# Patient Record
Sex: Female | Born: 1950 | Race: Black or African American | Hispanic: No | Marital: Single | Smoking: Current every day smoker
Health system: Southern US, Community
[De-identification: ages and names within clinical notes are randomized; demographics above are authoritative.]

## PROBLEM LIST (undated history)

## (undated) DIAGNOSIS — M1990 Unspecified osteoarthritis, unspecified site: Secondary | ICD-10-CM

## (undated) DIAGNOSIS — Z9889 Other specified postprocedural states: Secondary | ICD-10-CM

## (undated) DIAGNOSIS — R112 Nausea with vomiting, unspecified: Secondary | ICD-10-CM

## (undated) DIAGNOSIS — K219 Gastro-esophageal reflux disease without esophagitis: Secondary | ICD-10-CM

## (undated) DIAGNOSIS — I10 Essential (primary) hypertension: Secondary | ICD-10-CM

## (undated) HISTORY — PX: ABDOMINAL HYSTERECTOMY: SHX81

## (undated) HISTORY — PX: BLADDER SUSPENSION: SHX72

## (undated) HISTORY — PX: TUBAL LIGATION: SHX77

---

## 2004-06-07 ENCOUNTER — Emergency Department: Payer: Self-pay | Admitting: Unknown Physician Specialty

## 2004-10-07 ENCOUNTER — Emergency Department: Payer: Self-pay | Admitting: Emergency Medicine

## 2005-09-20 ENCOUNTER — Emergency Department: Payer: Self-pay | Admitting: Emergency Medicine

## 2005-11-27 ENCOUNTER — Emergency Department (HOSPITAL_COMMUNITY): Admission: EM | Admit: 2005-11-27 | Discharge: 2005-11-27 | Payer: Self-pay | Admitting: Emergency Medicine

## 2007-04-30 ENCOUNTER — Emergency Department: Payer: Self-pay | Admitting: Unknown Physician Specialty

## 2007-05-29 ENCOUNTER — Ambulatory Visit: Payer: Self-pay | Admitting: Unknown Physician Specialty

## 2007-08-25 ENCOUNTER — Ambulatory Visit: Payer: Self-pay

## 2007-08-31 ENCOUNTER — Ambulatory Visit: Payer: Self-pay

## 2008-05-02 ENCOUNTER — Emergency Department: Payer: Self-pay

## 2008-09-19 ENCOUNTER — Inpatient Hospital Stay (HOSPITAL_COMMUNITY): Admission: EM | Admit: 2008-09-19 | Discharge: 2008-09-20 | Payer: Self-pay | Admitting: Internal Medicine

## 2008-11-09 ENCOUNTER — Ambulatory Visit: Payer: Self-pay | Admitting: Internal Medicine

## 2008-11-09 LAB — CONVERTED CEMR LAB
ALT: 16 units/L (ref 0–35)
AST: 16 units/L (ref 0–37)
Albumin: 4.1 g/dL (ref 3.5–5.2)
Barbiturate Quant, Ur: NEGATIVE
Benzodiazepines.: NEGATIVE
Calcium: 9.9 mg/dL (ref 8.4–10.5)
Chloride: 105 meq/L (ref 96–112)
Cocaine Metabolites: POSITIVE — AB
Creatinine,U: 147.6 mg/dL
Microalb, Ur: 0.5 mg/dL (ref 0.00–1.89)
Phencyclidine (PCP): NEGATIVE
Potassium: 4.2 meq/L (ref 3.5–5.3)
Total Protein: 7.6 g/dL (ref 6.0–8.3)

## 2008-11-16 ENCOUNTER — Ambulatory Visit: Payer: Self-pay | Admitting: *Deleted

## 2008-11-28 ENCOUNTER — Emergency Department: Payer: Self-pay | Admitting: Emergency Medicine

## 2009-01-16 ENCOUNTER — Emergency Department: Payer: Self-pay

## 2009-02-10 ENCOUNTER — Emergency Department: Payer: Self-pay | Admitting: Emergency Medicine

## 2009-03-06 ENCOUNTER — Emergency Department: Payer: Self-pay | Admitting: Emergency Medicine

## 2009-03-30 ENCOUNTER — Emergency Department: Payer: Self-pay | Admitting: Emergency Medicine

## 2009-06-13 ENCOUNTER — Emergency Department: Payer: Self-pay | Admitting: Emergency Medicine

## 2010-01-24 ENCOUNTER — Emergency Department: Payer: Self-pay | Admitting: Internal Medicine

## 2010-04-01 ENCOUNTER — Emergency Department (HOSPITAL_COMMUNITY): Admission: EM | Admit: 2010-04-01 | Discharge: 2010-04-01 | Payer: Self-pay | Admitting: Emergency Medicine

## 2010-06-19 ENCOUNTER — Emergency Department (HOSPITAL_COMMUNITY): Admission: EM | Admit: 2010-06-19 | Discharge: 2010-06-19 | Payer: Self-pay | Admitting: Emergency Medicine

## 2010-10-02 ENCOUNTER — Emergency Department: Payer: Self-pay | Admitting: Emergency Medicine

## 2010-11-13 LAB — COMPREHENSIVE METABOLIC PANEL
ALT: 17 U/L (ref 0–35)
AST: 24 U/L (ref 0–37)
Albumin: 3.2 g/dL — ABNORMAL LOW (ref 3.5–5.2)
Alkaline Phosphatase: 88 U/L (ref 39–117)
BUN: 16 mg/dL (ref 6–23)
Chloride: 110 mEq/L (ref 96–112)
GFR calc Af Amer: >60 mL/min (ref >60)
Potassium: 4.4 mEq/L (ref 3.5–5.1)
Sodium: 139 mEq/L (ref 135–145)
Total Bilirubin: 0.4 mg/dL (ref 0.3–1.2)

## 2010-11-13 LAB — URINALYSIS, ROUTINE W REFLEX MICROSCOPIC
Ketones, ur: NEGATIVE mg/dL
Nitrite: NEGATIVE
Specific Gravity, Urine: 1.023 (ref 1.005–1.030)
pH: 5.5 (ref 5.0–8.0)

## 2010-11-13 LAB — DIFFERENTIAL
Basophils Absolute: 0 10*3/uL (ref 0.0–0.1)
Lymphocytes Relative: 11 % — ABNORMAL LOW (ref 12–46)
Monocytes Absolute: 0.5 10*3/uL (ref 0.1–1.0)
Neutro Abs: 8.3 10*3/uL — ABNORMAL HIGH (ref 1.7–7.7)

## 2010-11-13 LAB — CULTURE, BLOOD (ROUTINE X 2): Culture: NO GROWTH

## 2010-11-13 LAB — RAPID URINE DRUG SCREEN, HOSP PERFORMED
Amphetamines: NOT DETECTED
Tetrahydrocannabinol: POSITIVE — AB

## 2010-11-13 LAB — CBC
Hemoglobin: 13.1 g/dL (ref 12.0–15.0)
RDW: 16.9 % — ABNORMAL HIGH (ref 11.5–15.5)

## 2010-12-11 NOTE — H&P (Signed)
Katherine Hartman, Katherine Hartman                ACCOUNT NO.:  [id number]   MEDICAL RECORD NO.:  [id number]          PATIENT TYPE:  EMS   LOCATION:  MAJO                         FACILITY:  [facility]   PHYSICIAN:  Lonia Blood, M.D.      DATE OF BIRTH:  [birth year]   DATE OF ADMISSION:  09/19/2008  DATE OF DISCHARGE:                              HISTORY & PHYSICAL   PRIMARY CARE PHYSICIAN:  The patient is unassigned to [place].   PRESENTING COMPLAINT:  Nausea, vomiting, and abdominal pain.   HISTORY OF PRESENT ILLNESS:  The patient is a 60 year old African  American female with no significant past medical history who has not  been on any medications who apparently started experiencing epigastric  pain, nausea, and vomiting lasted the whole day.  She has 1-2 loose  bowel movements.  Otherwise, she has consistently had the nausea and  vomiting and unable to keep anything down.  She denied any fever  associated with it.  She denied any hematochezia.  No melena.  No bright  red blood per rectum.  The pain was rated as colicky in nature and 8-9  out of 10.  No relieving or aggravating factor.  More or less worsened  by foods.  She gets nauseated and vomits.  She denied any urinary  symptoms and denied any cough.  Denied any chest pain.   PAST MEDICAL HISTORY:  None.   ALLERGIES:  Allergic to IBUPROFEN.   PAST SURGICAL HISTORY:  None.   SOCIAL HISTORY:  The patient lives in [place].  She smokes about half  to one pack per day.  Denied any IV drug use.  No alcohol.   FAMILY HISTORY:  Significant for breast cancer, diabetes, and  hypertension on both sides of her family.   REVIEW OF SYSTEMS:  A 12-point review of systems is negative except per  HPI.   PHYSICAL EXAMINATION:  VITAL SIGNS:  Temperature 99.1.  Initial blood  pressure 185/117, currently 155/96.  Pulse of 98, respiratory rate 26,  sats 100% on room air.  GENERAL:  The patient looks acutely ill in mild distress.  HEENT:  PERRL.  EOMI.  NECK:   Supple.  No JVD.  No lymphadenopathy.  RESPIRATORY:  Good air entry bilaterally.  No wheezes, no rales.  CARDIOVASCULAR:  She has S1 and S2.  No murmur.  ABDOMEN:  Firm with epigastric tenderness and mild rebound tenderness.  She has some decreased bowel sounds.  EXTREMITIES:  No edema, cyanosis, or clubbing.   LABORATORY DATA:  Urinalysis is negative.  White count 10.2 with left  shift, ANC of 8.3, hemoglobin is 13.1, platelet count 281 with MCV of  83.  Sodium is 139, potassium 4.4, chloride 110, CO2 of 22, glucose 82,  BUN 16, creatinine 0.88, albumin 3.2, and calcium 9.0, the rest of the  LFTs are normal with alkaline phosphatase of 88, AST 24, ALT 17.  Lipase  is 25.  Acute abdominal series showed air-fluid levels without  dilatation of colon suggestive of ileus.  There was mild __________  scoliosis and low lung volumes without focal  airspace disease.  Abdominal ultrasound showed dilated common bile duct.  Recommendation of  liver function studies was suggested and probably ERCP or MRCP.  There  was no gallstone or sonographic findings for acute cholecystitis.  There  was unremarkable sonographic appearance of the pancreas, liver, spleen,  and both kidneys.  CT abdomen and pelvis showed stable mild biliary  dilatation of undetermined etiology and no obstructive bowel gas  pattern.   ASSESSMENT:  Therefore, this is a 60 year old female who presents with  nausea, vomiting, and abdominal pain.  The patient seems to have some  biliary colic with no apparent gallstones.  The mild ileus and dilated  common bile duct may suggest that she may have passed gallstones, but  again with her age and history, we cannot rule out something at the  sphincter like tumor, although that is not seen on CT.  The patient also  did not have any obstructive pattern since her LFTs are essentially  normal.  The cause is therefore quite unclear, but the severity of her  pain indicates that she did have some  disease in that area.   PLAN:  1. Biliary colic:  We will admit the patient for pain control.  I will      keep her n.p.o., hydrate her, and then do the pain control.  We      will get MRCP and possibly GI consult for subsequent ERCP.  Surgery      has been consulted, but it looks like there is no acute surgical      disease at this point.  Although the patient has some white count      and mild fever, there is no evidence of infection, so I will be      reluctant to start any antibiotics at this point.  Conservative      measures probably will be the best option for now.  2. High blood pressure.  The patient has no prior history of      hypertension.  We will follow her blood pressure in the hospital      and if it remains consistently high, we will treat the patient for      hypertension.  3. Tobacco abuse.  The patient has been counseled.  We will offer her      nicotine patch in the hospital for any type of cravings.      Otherwise, further treatment will depend on the patient's response      to our initial measures in the hospital.      Lonia Blood, M.D.  Electronically Signed     LG/MEDQ  D:  09/19/2008  T:  09/20/2008  Job:  [id number]

## 2010-12-11 NOTE — Discharge Summary (Signed)
[name], Katherine Hartman                ACCOUNT NO.:  [id number]   MEDICAL RECORD NO.:  [id number]          PATIENT TYPE:  INP   LOCATION:  5151                         FACILITY:  [facility]   PHYSICIAN:  Richarda Overlie, MD       DATE OF BIRTH:  06/05/1951   DATE OF ADMISSION:  09/19/2008  DATE OF DISCHARGE:  09/20/2008                               DISCHARGE SUMMARY   DISCHARGE DIAGNOSIS:  Nausea, vomiting, and abdominal pain likely viral  gastroenteritis  ileus resolved   SUBJECTIVE:  This is a 60 year old female who presented to the ER with a  chief complaint of nausea, vomiting, abdominal pain, and epigastric  pain.  She denied any associated fever, chills, rigors.  Denied any  diarrhea or constipation.  Denied any hematochezia or melena.  Denied  any bright red blood per rectum.  Pain was described as colicky in  nature.  Initial urinalysis was negative.  White count was negative.  Electrolytes were within normal limits.  Her liver panel and lipase were  also within normal limits.  Her urine drug screen was positive for  cocaine, opiates, and marijuana.  The patient had multiple diagnostic  studies done including an ultrasound of the abdomen that showed dilated  common bile duct dilated to about 8.3 mm.  An acute abdominal series  which was negative suggestive of ileus but no obstruction.  She also had  a CT scan of the abdomen and pelvis that showed stable mild biliary  dilatation of undetermined etiology and a nonobstructive bowel gas  pattern.  Because of her age, an MRCP was ordered that showed normal  appearance of the biliary and pancreatic duct.  No evidence of biliary  dilatation.  No evidence of choledocholithiasis.  These findings were  discussed with Dr. Evette Cristal, [facility], on call at [phone] and at this  point we did not feel that the patient needs any further investigation  given the normal ERCP.  I have recommended the patient get repeat CMP  and lipase in 5 to 7 days.   Recommended a low-fat, low-cholesterol,  lactose-free, mechanical, soft diet.   FOLLOWUP:  With [facility] in 5 to 7 days.   DISCHARGE MEDICATIONS:  1. Phenergan 25 mg per rectum every 6 hours p.r.n.  2. Ultram 50 mg p.o. every 6 hours p.r.n.  3. Prilosec 20 mg p.o. daily.      Richarda Overlie, MD  Electronically Signed     NA/MEDQ  D:  09/20/2008  T:  09/20/2008  Job:  [id number]

## 2011-02-05 ENCOUNTER — Emergency Department: Payer: Self-pay | Admitting: Emergency Medicine

## 2011-06-13 ENCOUNTER — Emergency Department: Payer: Self-pay | Admitting: *Deleted

## 2011-11-04 ENCOUNTER — Emergency Department (HOSPITAL_COMMUNITY)
Admission: EM | Admit: 2011-11-04 | Discharge: 2011-11-04 | Disposition: A | Discharge status: Home / Self Care | Payer: Self-pay | Attending: Emergency Medicine | Admitting: Emergency Medicine

## 2011-11-04 ENCOUNTER — Encounter (HOSPITAL_COMMUNITY): Payer: Self-pay | Admitting: Emergency Medicine

## 2011-11-04 ENCOUNTER — Emergency Department (HOSPITAL_COMMUNITY): Payer: Self-pay

## 2011-11-04 DIAGNOSIS — W108XXA Fall (on) (from) other stairs and steps, initial encounter: Secondary | ICD-10-CM | POA: Insufficient documentation

## 2011-11-04 DIAGNOSIS — M1710 Unilateral primary osteoarthritis, unspecified knee: Secondary | ICD-10-CM | POA: Insufficient documentation

## 2011-11-04 DIAGNOSIS — M25569 Pain in unspecified knee: Secondary | ICD-10-CM | POA: Insufficient documentation

## 2011-11-04 DIAGNOSIS — M7989 Other specified soft tissue disorders: Secondary | ICD-10-CM | POA: Insufficient documentation

## 2011-11-04 DIAGNOSIS — M1711 Unilateral primary osteoarthritis, right knee: Secondary | ICD-10-CM

## 2011-11-04 HISTORY — DX: Unspecified osteoarthritis, unspecified site: M19.90

## 2011-11-04 MED ORDER — CYCLOBENZAPRINE HCL 10 MG PO TABS: 10.0000 mg | ORAL_TABLET | Freq: Two times a day (BID) | ORAL | Status: AC | PRN

## 2011-11-04 MED ORDER — OXYCODONE-ACETAMINOPHEN 5-325 MG PO TABS: 1.0000 | ORAL_TABLET | ORAL | Status: AC | PRN

## 2011-11-04 MED ORDER — CYCLOBENZAPRINE HCL 10 MG PO TABS
10.0000 mg | ORAL_TABLET | Freq: Once | ORAL | Status: AC
  Administered 2011-11-04: 10 mg via ORAL
  Filled 2011-11-04: qty 1

## 2011-11-04 MED ORDER — OXYCODONE-ACETAMINOPHEN 5-325 MG PO TABS
1.0000 | ORAL_TABLET | Freq: Once | ORAL | Status: AC
  Administered 2011-11-04: 1 via ORAL
  Filled 2011-11-04: qty 1

## 2011-11-04 NOTE — ED Provider Notes (Signed)
History     CSN: [id number]  Arrival date & time 11/04/11  1220   First MD Initiated Contact with Patient 11/04/11 1238      No chief complaint on file.   (Consider location/radiation/quality/duration/timing/severity/associated sxs/prior treatment) Patient is a 60 y.o. female presenting with knee pain. The history is provided by the patient.  Knee Pain This is a new problem. The current episode started today. The problem occurs constantly. The problem has been gradually worsening. Associated symptoms include arthralgias and joint swelling. Pertinent negatives include no chills or fever.  Pt states she has arthritis and torn meniscus in right knee. States today she was walking up the steps, and states "my knee gave out and I fell."  States she fell onto the right knee. Reports increased pain, worsened with walking and bending her knee. No other injuries. Pt is able to bear weight, with pain.  No past medical history on file.  No past surgical history on file.  No family history on file.  History  Substance Use Topics  . Smoking status: Not on file  . Smokeless tobacco: Not on file  . Alcohol Use: Not on file    OB History    No data available      Review of Systems  Constitutional: Negative for fever and chills.  HENT: Negative.   Eyes: Negative.   Respiratory: Negative.   Gastrointestinal: Negative.   Genitourinary: Negative.   Musculoskeletal: Positive for joint swelling and arthralgias.  Skin: Negative.   Neurological: Negative.   Psychiatric/Behavioral: Negative.     Allergies  Nsaids  Home Medications  No current outpatient prescriptions on file.  BP 157/96  Pulse 99  Temp(Src) 98 F (36.7 C) (Oral)  Resp 18  SpO2 98%  Physical Exam  Nursing note and vitals reviewed. Constitutional: She is oriented to person, place, and time. She appears well-developed and well-nourished. No distress.       obese  HENT:  Head: Normocephalic.  Eyes: Conjunctivae  are normal.  Cardiovascular: Normal rate, regular rhythm and normal heart sounds.   Pulmonary/Chest: Effort normal and breath sounds normal. No respiratory distress.  Musculoskeletal:       Right knee normal appearing. Tender to palpation over right anterior and later knee. No bruising or swelling. Pain with ROM. Limmited ROm due to pain. Negative anterior and posterior drawer signs. Normal ankle and hip  Neurological: She is alert and oriented to person, place, and time.  Skin: Skin is warm and dry. No erythema.  Psychiatric: She has a normal mood and affect.    ED Course  Procedures (including critical care time)  Chronic pain, worsened after a fall. Pain medications given per request. Will get x-ray to r/o fractures.    Dg Knee Complete 4 Views Right  11/04/2011  *RADIOLOGY REPORT*  Clinical Data: Lateral knee pain status post fall today.  History of arthritis.  RIGHT KNEE - COMPLETE 4+ VIEW  Comparison: 06/19/2010 radiographs.  Findings: There is mild generalized osteopenia.  Tricompartmental degenerative changes are again noted, most advanced in the patellofemoral compartment.  No acute fracture, dislocation or significant knee joint effusion is seen.  IMPRESSION: Stable tricompartmental degenerative changes.  No acute osseous findings.  Original Report Authenticated By: Gerrianne Scale, M.D.   X-ray with no acute findings. Pt has been ambulatory around ED. She seems stable on her feet, asking for pain medications. Will treat until she is able to follow up in few days. I suspect this is exacerbation of  her arthritis. Instructed to keep leg elevated, icing it, rest.   1. Arthritis of knee, right       MDM           Lottie Mussel, PA 11/04/11 1551

## 2011-11-04 NOTE — Discharge Instructions (Signed)
Your x-ray showed arthritis of your knee joint. No other findings. Keep your knee elevated at home. Ice. Percocet for pain. Flexeril for severe pain. Follow up with your doctor or orthopedics for further treatment.   Wear and Tear Disorders of the Knee (Arthritis, Osteoarthritis) Everyone will experience wear and tear injuries (arthritis, osteoarthritis) of the knee. These are the changes we all get as we age. They come from the joint stress of daily living. The amount of cartilage damage in your knee and your symptoms determine if you need surgery. Mild problems require approximately two months recovery time. More severe problems take several months to recover. With mild problems, your surgeon may find worn and rough cartilage surfaces. With severe changes, your surgeon may find cartilage that has completely worn away and exposed the bone. Loose bodies of bone and cartilage, bone spurs (excess bone growth), and injuries to the menisci (cushions between the large bones of your leg) are also common. All of these problems can cause pain. For a mild wear and tear problem, rough cartilage may simply need to be shaved and smoothed. For more severe problems with areas of exposed bone, your surgeon may use an instrument for roughing up the bone surfaces to stimulate new cartilage growth. Loose bodies are usually removed. Torn menisci may be trimmed or repaired. ABOUT THE ARTHROSCOPIC PROCEDURE Arthroscopy is a surgical technique. It allows your orthopedic surgeon to diagnose and treat your knee injury with accuracy. The surgeon looks into your knee through a small scope. The scope is like a small (pencil-sized) telescope. Arthroscopy is less invasive than open knee surgery. You can expect a more rapid recovery. After the procedure, you will be moved to a recovery area until most of the effects of the medication have worn off. Your caregiver will discuss the test results with you. RECOVERY The severity of the  arthritis and the type of procedure performed will determine recovery time. Other important factors include age, physical condition, medical conditions, and the type of rehabilitation program. Strengthening your muscles after arthroscopy helps guarantee a better recovery. Follow your caregiver's instructions. Use crutches, rest, elevate, ice, and do knee exercises as instructed. Your caregivers will help you and instruct you with exercises and other physical therapy required to regain your mobility, muscle strength, and functioning following surgery. Only take over-the-counter or prescription medicines for pain, discomfort, or fever as directed by your caregiver.  SEEK MEDICAL CARE IF:   There is increased bleeding (more than a small spot) from the wound.   You notice redness, swelling, or increasing pain in the wound.   Pus is coming from wound.   You develop an unexplained oral temperature above 102 F (38.9 C) , or as your caregiver suggests.   You notice a foul smell coming from the wound or dressing.   You have severe pain with motion of the knee.  SEEK IMMEDIATE MEDICAL CARE IF:   You develop a rash.   You have difficulty breathing.   You have any allergic problems.  MAKE SURE YOU:   Understand these instructions.   Will watch your condition.   Will get help right away if you are not doing well or get worse.  Document Released: 07/12/2000 Document Revised: 07/04/2011 Document Reviewed: 12/09/2007 [facility] Patient Information 2012 [place], [place].

## 2011-11-05 NOTE — ED Provider Notes (Signed)
Medical screening examination/treatment/procedure(s) were performed by non-physician practitioner and as supervising physician I was immediately available for consultation/collaboration.  Tabor Bartram T Shirelle Tootle, MD 11/05/11 0757 

## 2012-01-09 ENCOUNTER — Encounter (HOSPITAL_COMMUNITY): Payer: Self-pay | Admitting: *Deleted

## 2012-01-09 ENCOUNTER — Emergency Department (HOSPITAL_COMMUNITY)
Admission: EM | Admit: 2012-01-09 | Discharge: 2012-01-09 | Disposition: A | Discharge status: Home / Self Care | Payer: Self-pay | Attending: Emergency Medicine | Admitting: Emergency Medicine

## 2012-01-09 DIAGNOSIS — F17200 Nicotine dependence, unspecified, uncomplicated: Secondary | ICD-10-CM | POA: Insufficient documentation

## 2012-01-09 DIAGNOSIS — IMO0002 Reserved for concepts with insufficient information to code with codable children: Secondary | ICD-10-CM | POA: Insufficient documentation

## 2012-01-09 DIAGNOSIS — M25569 Pain in unspecified knee: Secondary | ICD-10-CM | POA: Insufficient documentation

## 2012-01-09 DIAGNOSIS — M25561 Pain in right knee: Secondary | ICD-10-CM

## 2012-01-09 MED ORDER — IBUPROFEN 200 MG PO TABS
600.0000 mg | ORAL_TABLET | Freq: Once | ORAL | Status: AC
  Administered 2012-01-09: 600 mg via ORAL
  Filled 2012-01-09: qty 3

## 2012-01-09 MED ORDER — OXYCODONE-ACETAMINOPHEN 5-325 MG PO TABS
2.0000 | ORAL_TABLET | Freq: Once | ORAL | Status: AC
  Administered 2012-01-09: 2 via ORAL
  Filled 2012-01-09: qty 2

## 2012-01-09 MED ORDER — OXYCODONE-ACETAMINOPHEN 5-325 MG PO TABS: 1.0000 | ORAL_TABLET | ORAL | Status: AC | PRN

## 2012-01-09 MED ORDER — IBUPROFEN 600 MG PO TABS: 600.0000 mg | ORAL_TABLET | Freq: Three times a day (TID) | ORAL | Status: AC | PRN

## 2012-01-09 NOTE — Discharge Instructions (Signed)
Knee Pain The knee is the complex joint between your thigh and your lower leg. It is made up of bones, tendons, ligaments, and cartilage. The bones that make up the knee are:  The femur in the thigh.   The tibia and fibula in the lower leg.   The patella or kneecap riding in the groove on the lower femur.  CAUSES  Knee pain is a common complaint with many causes. A few of these causes are:  Injury, such as:   A ruptured ligament or tendon injury.   Torn cartilage.   Medical conditions, such as:   Gout   Arthritis   Infections   Overuse, over training or overdoing a physical activity.  Knee pain can be minor or severe. Knee pain can accompany debilitating injury. Minor knee problems often respond well to self-care measures or get well on their own. More serious injuries may need medical intervention or even surgery. SYMPTOMS The knee is complex. Symptoms of knee problems can vary widely. Some of the problems are:  Pain with movement and weight bearing.   Swelling and tenderness.   Buckling of the knee.   Inability to straighten or extend your knee.   Your knee locks and you cannot straighten it.   Warmth and redness with pain and fever.   Deformity or dislocation of the kneecap.  DIAGNOSIS  Determining what is wrong may be very straight forward such as when there is an injury. It can also be challenging because of the complexity of the knee. Tests to make a diagnosis may include:  Your caregiver taking a history and doing a physical exam.   Routine X-rays can be used to rule out other problems. X-rays will not reveal a cartilage tear. Some injuries of the knee can be diagnosed by:   Arthroscopy a surgical technique by which a small video camera is inserted through tiny incisions on the sides of the knee. This procedure is used to examine and repair internal knee joint problems. Tiny instruments can be used during arthroscopy to repair the torn knee cartilage  (meniscus).   Arthrography is a radiology technique. A contrast liquid is directly injected into the knee joint. Internal structures of the knee joint then become visible on X-ray film.   An MRI scan is a non x-ray radiology procedure in which magnetic fields and a computer produce two- or three-dimensional images of the inside of the knee. Cartilage tears are often visible using an MRI scanner. MRI scans have largely replaced arthrography in diagnosing cartilage tears of the knee.   Blood work.   Examination of the fluid that helps to lubricate the knee joint (synovial fluid). This is done by taking a sample out using a needle and a syringe.  TREATMENT The treatment of knee problems depends on the cause. Some of these treatments are:  Depending on the injury, proper casting, splinting, surgery or physical therapy care will be needed.   Give yourself adequate recovery time. Do not overuse your joints. If you begin to get sore during workout routines, back off. Slow down or do fewer repetitions.   For repetitive activities such as cycling or running, maintain your strength and nutrition.   Alternate muscle groups. For example if you are a weight lifter, work the upper body on one day and the lower body the next.   Either tight or weak muscles do not give the proper support for your knee. Tight or weak muscles do not absorb the stress placed   on the knee joint. Keep the muscles surrounding the knee strong.   Take care of mechanical problems.   If you have flat feet, orthotics or special shoes may help. See your caregiver if you need help.   Arch supports, sometimes with wedges on the inner or outer aspect of the heel, can help. These can shift pressure away from the side of the knee most bothered by osteoarthritis.   A brace called an "unloader" brace also may be used to help ease the pressure on the most arthritic side of the knee.   If your caregiver has prescribed crutches, braces,  wraps or ice, use as directed. The acronym for this is PRICE. This means protection, rest, ice, compression and elevation.   Nonsteroidal anti-inflammatory drugs (NSAID's), can help relieve pain. But if taken immediately after an injury, they may actually increase swelling. Take NSAID's with food in your stomach. Stop them if you develop stomach problems. Do not take these if you have a history of ulcers, stomach pain or bleeding from the bowel. Do not take without your caregiver's approval if you have problems with fluid retention, heart failure, or kidney problems.   For ongoing knee problems, physical therapy may be helpful.   Glucosamine and chondroitin are over-the-counter dietary supplements. Both may help relieve the pain of osteoarthritis in the knee. These medicines are different from the usual anti-inflammatory drugs. Glucosamine may decrease the rate of cartilage destruction.   Injections of a corticosteroid drug into your knee joint may help reduce the symptoms of an arthritis flare-up. They may provide pain relief that lasts a few months. You may have to wait a few months between injections. The injections do have a small increased risk of infection, water retention and elevated blood sugar levels.   Hyaluronic acid injected into damaged joints may ease pain and provide lubrication. These injections may work by reducing inflammation. A series of shots may give relief for as long as 6 months.   Topical painkillers. Applying certain ointments to your skin may help relieve the pain and stiffness of osteoarthritis. Ask your pharmacist for suggestions. Many over the-counter products are approved for temporary relief of arthritis pain.   In some countries, doctors often prescribe topical NSAID's for relief of chronic conditions such as arthritis and tendinitis. A review of treatment with NSAID creams found that they worked as well as oral medications but without the serious side effects.    PREVENTION  Maintain a healthy weight. Extra pounds put more strain on your joints.   Get strong, stay limber. Weak muscles are a common cause of knee injuries. Stretching is important. Include flexibility exercises in your workouts.   Be smart about exercise. If you have osteoarthritis, chronic knee pain or recurring injuries, you may need to change the way you exercise. This does not mean you have to stop being active. If your knees ache after jogging or playing basketball, consider switching to swimming, water aerobics or other low-impact activities, at least for a few days a week. Sometimes limiting high-impact activities will provide relief.   Make sure your shoes fit well. Choose footwear that is right for your sport.   Protect your knees. Use the proper gear for knee-sensitive activities. Use kneepads when playing volleyball or laying carpet. Buckle your seat belt every time you drive. Most shattered kneecaps occur in car accidents.   Rest when you are tired.  SEEK MEDICAL CARE IF:  You have knee pain that is continual and does not   seem to be getting better.  SEEK IMMEDIATE MEDICAL CARE IF:  Your knee joint feels hot to the touch and you have a high fever. MAKE SURE YOU:   Understand these instructions.   Will watch your condition.   Will get help right away if you are not doing well or get worse.  Document Released: 05/12/2007 Document Revised: 07/04/2011 Document Reviewed: 05/12/2007 [employer] Patient Information 2012 [employer]. 

## 2012-01-09 NOTE — ED Provider Notes (Signed)
History  Scribed for Katherine Co, MD, the patient was seen in room STRE1/STRE1. This chart was scribed by Katherine Hartman. The patient's care started at 11:55 AM   CSN: [id number]  Arrival date & time 01/09/12  1052   First MD Initiated Contact with Patient 01/09/12 1104      Chief Complaint  Patient presents with  . Knee Pain     The history is provided by the patient.   Katherine Hartman is a 61 y.o. female who presents to the Emergency Department complaining of right knee pain which started several months ago and has gotten worse recently.  She is experiencing swelling of the right leg.  She states that she recently bumped her knee.  Pt has h/o arthritis and a meniscus tear to the right knee.  She currently does not have an orthopaedic surgeon due to lack of insurance and states that she recently turned 61 and should be getting coverage soon.  Bending and walking makes the pain worse.  She has taken tylenol with no relief.  Her pain is moderate to severe in severity     Past Medical History  Diagnosis Date  . Arthritis     Past Surgical History  Procedure Date  . Abdominal hysterectomy   . Tubal ligation     Family History  Problem Relation Age of Onset  . Cancer Mother   . Hypertension Mother   . Diabetes Mother     History  Substance Use Topics  . Smoking status: Current Everyday Smoker    Types: Cigarettes  . Smokeless tobacco: Not on file  . Alcohol Use: No    OB History    Grav Para Term Preterm Abortions TAB SAB Ect Mult Living                  Review of Systems  All other systems reviewed and are negative.    Allergies  Nsaids  Home Medications   Current Outpatient Rx  Name Route Sig Dispense Refill  . ADULT MULTIVITAMIN W/MINERALS CH Oral Take 1 tablet by mouth daily.      BP 154/100  Pulse 101  Temp 97.9 F (36.6 C) (Oral)  Resp 18  SpO2 100%  Physical Exam  Nursing note and vitals reviewed. Constitutional: She is oriented to  person, place, and time. She appears well-developed and well-nourished. No distress.  HENT:  Head: Normocephalic and atraumatic.  Eyes: EOM are normal.  Neck: Neck supple. No tracheal deviation present.  Cardiovascular: Normal rate.   Pulmonary/Chest: Effort normal. No respiratory distress.  Musculoskeletal: Normal range of motion.       Normal pulse in right foot Mild medial and lateral joint line tenderness, no warmth or erythema.  No joint effusion.   Neurological: She is alert and oriented to person, place, and time.  Skin: Skin is warm and dry.  Psychiatric: She has a normal mood and affect. Her behavior is normal.    ED Course  Procedures   DIAGNOSTIC STUDIES: Oxygen Saturation is 100% on room air, normal by my interpretation.    COORDINATION OF CARE:     Labs Reviewed - No data to display No results found.   1. Right knee pain       MDM  This is likely arthritis of the patient's right knee.  There is no signs of septic arthritis.  She has normal pulses in her right leg.  She has no significant swelling of her right knee her  right leg to suggest DVT.  She does not require images at this time.  She and related to the bathroom without difficulty.  Her pain be treated and she will be referred to an orthopedic surgeon for ongoing followup  I personally performed the services described in this documentation, which was scribed in my presence. The recorded information has been reviewed and considered.          Katherine Co, MD 01/09/12 [phone]

## 2012-01-09 NOTE — ED Notes (Signed)
Pt reports chronic hx of right knee pain but states today it is worse, states she has been climbing stairs and sitting in recliner for extended periods of time.

## 2012-01-21 ENCOUNTER — Emergency Department: Payer: Self-pay | Admitting: Emergency Medicine

## 2012-02-14 ENCOUNTER — Emergency Department: Payer: Self-pay | Admitting: Emergency Medicine

## 2012-04-08 ENCOUNTER — Encounter (HOSPITAL_COMMUNITY): Payer: Self-pay | Admitting: Physical Medicine and Rehabilitation

## 2012-04-08 ENCOUNTER — Emergency Department (HOSPITAL_COMMUNITY): Payer: Self-pay

## 2012-04-08 ENCOUNTER — Emergency Department (HOSPITAL_COMMUNITY)
Admission: EM | Admit: 2012-04-08 | Discharge: 2012-04-08 | Disposition: A | Discharge status: Home / Self Care | Payer: Self-pay | Attending: Emergency Medicine | Admitting: Emergency Medicine

## 2012-04-08 DIAGNOSIS — Z809 Family history of malignant neoplasm, unspecified: Secondary | ICD-10-CM | POA: Insufficient documentation

## 2012-04-08 DIAGNOSIS — F17200 Nicotine dependence, unspecified, uncomplicated: Secondary | ICD-10-CM | POA: Insufficient documentation

## 2012-04-08 DIAGNOSIS — M25569 Pain in unspecified knee: Secondary | ICD-10-CM | POA: Insufficient documentation

## 2012-04-08 DIAGNOSIS — M1990 Unspecified osteoarthritis, unspecified site: Secondary | ICD-10-CM | POA: Insufficient documentation

## 2012-04-08 DIAGNOSIS — Z8249 Family history of ischemic heart disease and other diseases of the circulatory system: Secondary | ICD-10-CM | POA: Insufficient documentation

## 2012-04-08 DIAGNOSIS — Z833 Family history of diabetes mellitus: Secondary | ICD-10-CM | POA: Insufficient documentation

## 2012-04-08 DIAGNOSIS — Z888 Allergy status to other drugs, medicaments and biological substances status: Secondary | ICD-10-CM | POA: Insufficient documentation

## 2012-04-08 MED ORDER — PROMETHAZINE HCL 25 MG PO TABS
25.0000 mg | ORAL_TABLET | Freq: Four times a day (QID) | ORAL | Status: DC | PRN
Start: 2012-04-08 — End: 2012-07-30

## 2012-04-08 MED ORDER — OXYCODONE-ACETAMINOPHEN 5-325 MG PO TABS: 1.0000 | ORAL_TABLET | Freq: Once | ORAL | Status: AC

## 2012-04-08 MED ORDER — CYCLOBENZAPRINE HCL 10 MG PO TABS
10.0000 mg | ORAL_TABLET | Freq: Once | ORAL | Status: AC
  Administered 2012-04-08: 10 mg via ORAL
  Filled 2012-04-08: qty 1

## 2012-04-08 MED ORDER — OXYCODONE-ACETAMINOPHEN 5-325 MG PO TABS
1.0000 | ORAL_TABLET | Freq: Once | ORAL | Status: AC
  Administered 2012-04-08: 1 via ORAL
  Filled 2012-04-08: qty 1

## 2012-04-08 MED ORDER — CYCLOBENZAPRINE HCL 10 MG PO TABS: 10.0000 mg | ORAL_TABLET | Freq: Three times a day (TID) | ORAL | Status: AC | PRN

## 2012-04-08 NOTE — Discharge Instructions (Signed)
Arthralgia Your caregiver has diagnosed you as suffering from an arthralgia. Arthralgia means there is pain in a joint. This can come from many reasons including:  Bruising the joint which causes soreness (inflammation) in the joint.   Wear and tear on the joints which occur as we grow older (osteoarthritis).   Overusing the joint.   Various forms of arthritis.   Infections of the joint.  Regardless of the cause of pain in your joint, most of these different pains respond to anti-inflammatory drugs and rest. The exception to this is when a joint is infected, and these cases are treated with antibiotics, if it is a bacterial infection. HOME CARE INSTRUCTIONS   Rest the injured area for as long as directed by your caregiver. Then slowly start using the joint as directed by your caregiver and as the pain allows. Crutches as directed may be useful if the ankles, knees or hips are involved. If the knee was splinted or casted, continue use and care as directed. If an stretchy or elastic wrapping bandage has been applied today, it should be removed and re-applied every 3 to 4 hours. It should not be applied tightly, but firmly enough to keep swelling down. Watch toes and feet for swelling, bluish discoloration, coldness, numbness or excessive pain. If any of these problems (symptoms) occur, remove the ace bandage and re-apply more loosely. If these symptoms persist, contact your caregiver or return to this location.   For the first 24 hours, keep the injured extremity elevated on pillows while lying down.   Apply ice for 15 to 20 minutes to the sore joint every couple hours while awake for the first half day. Then 3 to 4 times per day for the first 48 hours. Put the ice in a plastic bag and place a towel between the bag of ice and your skin.   Wear any splinting, casting, elastic bandage applications, or slings as instructed.   Only take over-the-counter or prescription medicines for pain,  discomfort, or fever as directed by your caregiver. Do not use aspirin immediately after the injury unless instructed by your physician. Aspirin can cause increased bleeding and bruising of the tissues.   If you were given crutches, continue to use them as instructed and do not resume weight bearing on the sore joint until instructed.  Persistent pain and inability to use the sore joint as directed for more than 2 to 3 days are warning signs indicating that you should see a caregiver for a follow-up visit as soon as possible. Initially, a hairline fracture (break in bone) may not be evident on X-rays. Persistent pain and swelling indicate that further evaluation, non-weight bearing or use of the joint (use of crutches or slings as instructed), or further X-rays are indicated. X-rays may sometimes not show a small fracture until a week or 10 days later. Make a follow-up appointment with your own caregiver or one to whom we have referred you. A radiologist (specialist in reading X-rays) may read your X-rays. Make sure you know how you are to obtain your X-ray results. Do not assume everything is normal if you do not hear from us. SEEK MEDICAL CARE IF: Bruising, swelling, or pain increases. SEEK IMMEDIATE MEDICAL CARE IF:   Your fingers or toes are numb or blue.   The pain is not responding to medications and continues to stay the same or get worse.   The pain in your joint becomes severe.   You develop a fever over   102 F (38.9 C).   It becomes impossible to move or use the joint.  MAKE SURE YOU:   Understand these instructions.   Will watch your condition.   Will get help right away if you are not doing well or get worse.  Document Released: 07/15/2005 Document Revised: 07/04/2011 Document Reviewed: 03/02/2008 [employer] Patient Information 2012 [employer].Knee Pain The knee is the complex joint between your thigh and your lower leg. It is made up of bones, tendons, ligaments, and  cartilage. The bones that make up the knee are:  The femur in the thigh.   The tibia and fibula in the lower leg.   The patella or kneecap riding in the groove on the lower femur.  CAUSES  Knee pain is a common complaint with many causes. A few of these causes are:  Injury, such as:   A ruptured ligament or tendon injury.   Torn cartilage.   Medical conditions, such as:   Gout   Arthritis   Infections   Overuse, over training or overdoing a physical activity.  Knee pain can be minor or severe. Knee pain can accompany debilitating injury. Minor knee problems often respond well to self-care measures or get well on their own. More serious injuries may need medical intervention or even surgery. SYMPTOMS The knee is complex. Symptoms of knee problems can vary widely. Some of the problems are:  Pain with movement and weight bearing.   Swelling and tenderness.   Buckling of the knee.   Inability to straighten or extend your knee.   Your knee locks and you cannot straighten it.   Warmth and redness with pain and fever.   Deformity or dislocation of the kneecap.  DIAGNOSIS  Determining what is wrong may be very straight forward such as when there is an injury. It can also be challenging because of the complexity of the knee. Tests to make a diagnosis may include:  Your caregiver taking a history and doing a physical exam.   Routine X-rays can be used to rule out other problems. X-rays will not reveal a cartilage tear. Some injuries of the knee can be diagnosed by:   Arthroscopy a surgical technique by which a small video camera is inserted through tiny incisions on the sides of the knee. This procedure is used to examine and repair internal knee joint problems. Tiny instruments can be used during arthroscopy to repair the torn knee cartilage (meniscus).   Arthrography is a radiology technique. A contrast liquid is directly injected into the knee joint. Internal structures of  the knee joint then become visible on X-ray film.   An MRI scan is a non x-ray radiology procedure in which magnetic fields and a computer produce two- or three-dimensional images of the inside of the knee. Cartilage tears are often visible using an MRI scanner. MRI scans have largely replaced arthrography in diagnosing cartilage tears of the knee.   Blood work.   Examination of the fluid that helps to lubricate the knee joint (synovial fluid). This is done by taking a sample out using a needle and a syringe.  TREATMENT The treatment of knee problems depends on the cause. Some of these treatments are:  Depending on the injury, proper casting, splinting, surgery or physical therapy care will be needed.   Give yourself adequate recovery time. Do not overuse your joints. If you begin to get sore during workout routines, back off. Slow down or do fewer repetitions.   For repetitive activities   such as cycling or running, maintain your strength and nutrition.   Alternate muscle groups. For example if you are a weight lifter, work the upper body on one day and the lower body the next.   Either tight or weak muscles do not give the proper support for your knee. Tight or weak muscles do not absorb the stress placed on the knee joint. Keep the muscles surrounding the knee strong.   Take care of mechanical problems.   If you have flat feet, orthotics or special shoes may help. See your caregiver if you need help.   Arch supports, sometimes with wedges on the inner or outer aspect of the heel, can help. These can shift pressure away from the side of the knee most bothered by osteoarthritis.   A brace called an "unloader" brace also may be used to help ease the pressure on the most arthritic side of the knee.   If your caregiver has prescribed crutches, braces, wraps or ice, use as directed. The acronym for this is PRICE. This means protection, rest, ice, compression and elevation.   Nonsteroidal  anti-inflammatory drugs (NSAID's), can help relieve pain. But if taken immediately after an injury, they may actually increase swelling. Take NSAID's with food in your stomach. Stop them if you develop stomach problems. Do not take these if you have a history of ulcers, stomach pain or bleeding from the bowel. Do not take without your caregiver's approval if you have problems with fluid retention, heart failure, or kidney problems.   For ongoing knee problems, physical therapy may be helpful.   Glucosamine and chondroitin are over-the-counter dietary supplements. Both may help relieve the pain of osteoarthritis in the knee. These medicines are different from the usual anti-inflammatory drugs. Glucosamine may decrease the rate of cartilage destruction.   Injections of a corticosteroid drug into your knee joint may help reduce the symptoms of an arthritis flare-up. They may provide pain relief that lasts a few months. You may have to wait a few months between injections. The injections do have a small increased risk of infection, water retention and elevated blood sugar levels.   Hyaluronic acid injected into damaged joints may ease pain and provide lubrication. These injections may work by reducing inflammation. A series of shots may give relief for as long as 6 months.   Topical painkillers. Applying certain ointments to your skin may help relieve the pain and stiffness of osteoarthritis. Ask your pharmacist for suggestions. Many over the-counter products are approved for temporary relief of arthritis pain.   In some countries, doctors often prescribe topical NSAID's for relief of chronic conditions such as arthritis and tendinitis. A review of treatment with NSAID creams found that they worked as well as oral medications but without the serious side effects.  PREVENTION  Maintain a healthy weight. Extra pounds put more strain on your joints.   Get strong, stay limber. Weak muscles are a common  cause of knee injuries. Stretching is important. Include flexibility exercises in your workouts.   Be smart about exercise. If you have osteoarthritis, chronic knee pain or recurring injuries, you may need to change the way you exercise. This does not mean you have to stop being active. If your knees ache after jogging or playing basketball, consider switching to swimming, water aerobics or other low-impact activities, at least for a few days a week. Sometimes limiting high-impact activities will provide relief.   Make sure your shoes fit well. Choose footwear that is right   for your sport.   Protect your knees. Use the proper gear for knee-sensitive activities. Use kneepads when playing volleyball or laying carpet. Buckle your seat belt every time you drive. Most shattered kneecaps occur in car accidents.   Rest when you are tired.  SEEK MEDICAL CARE IF:  You have knee pain that is continual and does not seem to be getting better.  SEEK IMMEDIATE MEDICAL CARE IF:  Your knee joint feels hot to the touch and you have a high fever. MAKE SURE YOU:   Understand these instructions.   Will watch your condition.   Will get help right away if you are not doing well or get worse.  Document Released: 05/12/2007 Document Revised: 07/04/2011 Document Reviewed: 05/12/2007 [employer] Patient Information 2012 [employer]. 

## 2012-04-08 NOTE — ED Provider Notes (Signed)
History    This chart was scribed for Flint Melter, MD, MD by Smitty Pluck. The patient was seen in room TR04C and the patient's care was started at 1:39PM.   CSN: [id number]  Arrival date & time 04/08/12  1158   First MD Initiated Contact with Patient 04/08/12 1333      Chief Complaint  Patient presents with  . Knee Pain    (Consider location/radiation/quality/duration/timing/severity/associated sxs/prior treatment) Patient is a 61 y.o. female presenting with knee pain. The history is provided by the patient. No language interpreter was used.  Knee Pain Pertinent negatives include no shortness of breath.   Katherine Hartman is a 61 y.o. female who presents to the Emergency Department with hx of arthritis complaining of constant, moderate, bilateral knee pain onset 1 day ago due to fall onset 1 day ago. Pt reports that her left leg "gave out" causing her to fall to the ground while walking. She reports more pain with right knee compared to left knee. Pt is ambulatory but reports pain is aggravated by walking and bending.   Past Medical History  Diagnosis Date  . Arthritis     Past Surgical History  Procedure Date  . Abdominal hysterectomy   . Tubal ligation     Family History  Problem Relation Age of Onset  . Cancer Mother   . Hypertension Mother   . Diabetes Mother     History  Substance Use Topics  . Smoking status: Current Every Day Smoker    Types: Cigarettes  . Smokeless tobacco: Not on file  . Alcohol Use: No    OB History    Grav Para Term Preterm Abortions TAB SAB Ect Mult Living                  Review of Systems  Constitutional: Negative for fever and chills.  Respiratory: Negative for shortness of breath.   Gastrointestinal: Negative for nausea and vomiting.  Musculoskeletal: Positive for joint swelling and arthralgias.  Neurological: Negative for weakness.  All other systems reviewed and are negative.    Allergies  Nsaids  Home  Medications   Current Outpatient Rx  Name Route Sig Dispense Refill  . CYCLOBENZAPRINE HCL 10 MG PO TABS Oral Take 1 tablet (10 mg total) by mouth 3 (three) times daily as needed for muscle spasms. 30 tablet 0  . OXYCODONE-ACETAMINOPHEN 5-325 MG PO TABS Oral Take 1 tablet by mouth once. 30 tablet 0  . PROMETHAZINE HCL 25 MG PO TABS Oral Take 1 tablet (25 mg total) by mouth every 6 (six) hours as needed for nausea. 30 tablet 0    BP 180/102  Pulse 101  Temp 98.7 F (37.1 C) (Oral)  Resp 18  SpO2 100%  Physical Exam  Nursing note and vitals reviewed. Constitutional: She is oriented to person, place, and time. She appears well-developed and well-nourished. No distress.  HENT:  Head: Normocephalic and atraumatic.  Eyes: EOM are normal.  Neck: Neck supple. No tracheal deviation present.  Cardiovascular: Normal rate.   Pulmonary/Chest: Effort normal. No respiratory distress.  Musculoskeletal: Normal range of motion.       Anterior tenderness with swelling and small effusion of right knee   Neurological: She is alert and oriented to person, place, and time.  Skin: Skin is warm and dry.  Psychiatric: She has a normal mood and affect. Her behavior is normal.    ED Course  Procedures (including critical care time) DIAGNOSTIC STUDIES: Oxygen Saturation  is 100% on room air, normal by my interpretation.    COORDINATION OF CARE: 1:43 PM Discussed pt ED treatment with pt  1:43 PM Ordered:   Medications  promethazine (PHENERGAN) 25 MG tablet (not administered)  cyclobenzaprine (FLEXERIL) 10 MG tablet (not administered)  oxyCODONE-acetaminophen (PERCOCET/ROXICET) 5-325 MG per tablet (not administered)  cyclobenzaprine (FLEXERIL) tablet 10 mg (10 mg Oral Given 04/08/12 1401)  oxyCODONE-acetaminophen (PERCOCET/ROXICET) 5-325 MG per tablet 1 tablet (1 tablet Oral Given 04/08/12 1401)       Labs Reviewed - No data to display Dg Knee Complete 4 Views Right  04/08/2012  *RADIOLOGY  REPORT*  Clinical Data: Right knee pain.  History of fall.  RIGHT KNEE - COMPLETE 4+ VIEW  Comparison: 11/04/2011.  Findings: The bones are osteopenic.  No acute fracture, subluxation or dislocation.  There is joint space narrowing, subchondral sclerosis, subchondral cyst formation and osteophyte formation in a tricompartmental distribution, compatible with osteoarthritis. This is most severe in the patellofemoral compartment.  IMPRESSION: 1.  No acute radiographic abnormality of the right knee. 2.  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 3.  Osteopenia.   Original Report Authenticated By: Florencia Reasons, M.D.      1. Knee pain   2. Degenerative joint disease       MDM  Fall, with residual knee pain, and plain film evidence of arthritis, right knee. Doubt fracture, septic joint or radicular pain. She is stable for discharge.      I personally performed the services described in this documentation, which was scribed in my presence. The recorded information has been reviewed and considered.    Plan: Home Medications- Phenergan, Flexeril, Percocet; Home Treatments- Heat; Recommended follow up- Ortho f/u   Flint Melter, MD 04/08/12 1816

## 2012-04-08 NOTE — ED Notes (Signed)
Pt presents to department for evaluation of bilateral knee pain. States she missed step yesterday and fell down stairs, landed on both knees. 8/10 pain at the time. She is alert and oriented x4. Ambulatory to triage.

## 2012-07-30 ENCOUNTER — Emergency Department (HOSPITAL_COMMUNITY): Payer: Self-pay

## 2012-07-30 ENCOUNTER — Emergency Department (HOSPITAL_COMMUNITY)
Admission: EM | Admit: 2012-07-30 | Discharge: 2012-07-30 | Disposition: A | Discharge status: Home / Self Care | Payer: Self-pay | Attending: Emergency Medicine | Admitting: Emergency Medicine

## 2012-07-30 ENCOUNTER — Encounter (HOSPITAL_COMMUNITY): Payer: Self-pay | Admitting: Cardiology

## 2012-07-30 DIAGNOSIS — Y939 Activity, unspecified: Secondary | ICD-10-CM | POA: Insufficient documentation

## 2012-07-30 DIAGNOSIS — IMO0002 Reserved for concepts with insufficient information to code with codable children: Secondary | ICD-10-CM | POA: Insufficient documentation

## 2012-07-30 DIAGNOSIS — M549 Dorsalgia, unspecified: Secondary | ICD-10-CM

## 2012-07-30 DIAGNOSIS — Z8739 Personal history of other diseases of the musculoskeletal system and connective tissue: Secondary | ICD-10-CM | POA: Insufficient documentation

## 2012-07-30 DIAGNOSIS — Y929 Unspecified place or not applicable: Secondary | ICD-10-CM | POA: Insufficient documentation

## 2012-07-30 DIAGNOSIS — R296 Repeated falls: Secondary | ICD-10-CM | POA: Insufficient documentation

## 2012-07-30 DIAGNOSIS — S99929A Unspecified injury of unspecified foot, initial encounter: Secondary | ICD-10-CM | POA: Insufficient documentation

## 2012-07-30 DIAGNOSIS — S8990XA Unspecified injury of unspecified lower leg, initial encounter: Secondary | ICD-10-CM | POA: Insufficient documentation

## 2012-07-30 DIAGNOSIS — F17200 Nicotine dependence, unspecified, uncomplicated: Secondary | ICD-10-CM | POA: Insufficient documentation

## 2012-07-30 MED ORDER — CYCLOBENZAPRINE HCL 10 MG PO TABS
10.0000 mg | ORAL_TABLET | Freq: Once | ORAL | Status: AC
  Administered 2012-07-30: 10 mg via ORAL
  Filled 2012-07-30: qty 1

## 2012-07-30 MED ORDER — OXYCODONE-ACETAMINOPHEN 5-325 MG PO TABS
2.0000 | ORAL_TABLET | Freq: Once | ORAL | Status: AC
  Administered 2012-07-30: 2 via ORAL
  Filled 2012-07-30: qty 2

## 2012-07-30 MED ORDER — ONDANSETRON 4 MG PO TBDP
8.0000 mg | ORAL_TABLET | Freq: Once | ORAL | Status: AC
  Administered 2012-07-30: 8 mg via ORAL
  Filled 2012-07-30: qty 2

## 2012-07-30 MED ORDER — OXYCODONE-ACETAMINOPHEN 5-325 MG PO TABS: 2.0000 | ORAL_TABLET | Freq: Four times a day (QID) | ORAL | Status: DC | PRN

## 2012-07-30 MED ORDER — CYCLOBENZAPRINE HCL 10 MG PO TABS: 10.0000 mg | ORAL_TABLET | Freq: Three times a day (TID) | ORAL | Status: DC | PRN

## 2012-07-30 MED ORDER — PROMETHAZINE HCL 25 MG PO TABS: 25.0000 mg | ORAL_TABLET | Freq: Four times a day (QID) | ORAL | Status: DC | PRN

## 2012-07-30 NOTE — ED Notes (Signed)
Patient transported to X-ray 

## 2012-07-30 NOTE — ED Notes (Signed)
Returned from xray

## 2012-07-30 NOTE — ED Provider Notes (Signed)
History     CSN: [id number]  Arrival date & time 07/30/12  1019   First MD Initiated Contact with Patient 07/30/12 1029      Chief Complaint  Patient presents with  . Back Pain  . Knee Pain    (Consider location/radiation/quality/duration/timing/severity/associated sxs/prior treatment) Patient is a 62 y.o. female presenting with back pain and knee pain. The history is provided by the patient.  Back Pain  This is a new problem. Pertinent negatives include no fever, no numbness, no headaches and no abdominal pain. Associated symptoms comments: She reports that her left knee gave out and she fell yesterday to a sitting position causing back pain. No radiation of pain. No numbness, urinary incontinence or bowel incontinence.She reports the knee weakness is a chronic thing and is no worse today..  Knee Pain Pertinent negatives include no abdominal pain, chills, fever, headaches, nausea or numbness.    Past Medical History  Diagnosis Date  . Arthritis     Past Surgical History  Procedure Date  . Abdominal hysterectomy   . Tubal ligation     Family History  Problem Relation Age of Onset  . Cancer Mother   . Hypertension Mother   . Diabetes Mother     History  Substance Use Topics  . Smoking status: Current Every Day Smoker    Types: Cigarettes  . Smokeless tobacco: Not on file  . Alcohol Use: No    OB History    Grav Para Term Preterm Abortions TAB SAB Ect Mult Living                  Review of Systems  Constitutional: Negative for fever and chills.  HENT: Negative.   Respiratory: Negative.   Cardiovascular: Negative.   Gastrointestinal: Negative.  Negative for nausea and abdominal pain.  Musculoskeletal: Positive for back pain.  Skin: Negative.   Neurological: Negative.  Negative for numbness and headaches.    Allergies  Nsaids  Home Medications   Current Outpatient Rx  Name  Route  Sig  Dispense  Refill  . ADULT MULTIVITAMIN W/MINERALS CH   Oral  Take 1 tablet by mouth daily.           BP 148/98  Pulse 98  Temp 98.1 F (36.7 C) (Oral)  SpO2 100%  Physical Exam  Constitutional: She is oriented to person, place, and time. She appears well-developed and well-nourished.  HENT:  Head: Normocephalic.  Neck: Normal range of motion. Neck supple.  Cardiovascular: Normal rate and regular rhythm.   Pulmonary/Chest: Effort normal and breath sounds normal.  Abdominal: Soft. Bowel sounds are normal. There is no tenderness. There is no rebound and no guarding.  Musculoskeletal: Normal range of motion.       Mild lumbar and paralumbar tenderness without swelling or discoloration.  Neurological: She is alert and oriented to person, place, and time. She has normal reflexes. Coordination normal.  Skin: Skin is warm and dry. No rash noted.  Psychiatric: She has a normal mood and affect.    ED Course  Procedures (including critical care time)  Labs Reviewed - No data to display Dg Lumbar Spine Complete  07/30/2012  *RADIOLOGY REPORT*  Clinical Data: Fall, knee and back pain  LUMBAR SPINE - COMPLETE 4+ VIEW  Comparison: Prior CT abdomen 09/19/2008  Findings: Frontal, lateral and bilateral oblique radiographs of the lumbosacral spine demonstrates no evidence for acute fracture or malalignment.  Vertebral body heights are maintained.  The bones appear mildly osteopenic.  Visualized bowel gas pattern is unremarkable.  Very mild disc space narrowing at L5-S1 with increased sclerosis of the facets consistent with mild degenerative disc disease and facet arthropathy.  IMPRESSION:  1.  No acute fracture or malalignment 2.  Mild degenerative disc disease and facet arthropathy at L5-S1   Original Report Authenticated By: Malachy Moan, M.D.      No diagnosis found. 1. Back pain   MDM  Pain is improved with PO medications. X-ray negative. Will encourage PCP follow up if pain continues.        Arnoldo Hooker, PA-C 07/30/12 1231  Arnoldo Hooker, PA-C 08/13/12 [phone]

## 2012-07-30 NOTE — ED Notes (Signed)
Pt reports her right knee "gave out on her this morning". States "I slid down to my butt". Denies any LOC or hitting her head. No deformities noted.

## 2012-08-05 ENCOUNTER — Emergency Department: Payer: Self-pay | Admitting: Emergency Medicine

## 2012-08-14 NOTE — ED Provider Notes (Signed)
Medical screening examination/treatment/procedure(s) were performed by non-physician practitioner and as supervising physician I was immediately available for consultation/collaboration.    Nelia Shi, MD 08/14/12 2032

## 2012-11-05 ENCOUNTER — Encounter (HOSPITAL_COMMUNITY): Payer: Self-pay | Admitting: *Deleted

## 2012-11-05 ENCOUNTER — Emergency Department (HOSPITAL_COMMUNITY): Payer: Self-pay

## 2012-11-05 ENCOUNTER — Emergency Department (HOSPITAL_COMMUNITY)
Admission: EM | Admit: 2012-11-05 | Discharge: 2012-11-05 | Disposition: A | Discharge status: Home / Self Care | Payer: Self-pay | Attending: Emergency Medicine | Admitting: Emergency Medicine

## 2012-11-05 DIAGNOSIS — F17200 Nicotine dependence, unspecified, uncomplicated: Secondary | ICD-10-CM | POA: Insufficient documentation

## 2012-11-05 DIAGNOSIS — G8929 Other chronic pain: Secondary | ICD-10-CM | POA: Insufficient documentation

## 2012-11-05 DIAGNOSIS — M25569 Pain in unspecified knee: Secondary | ICD-10-CM | POA: Insufficient documentation

## 2012-11-05 DIAGNOSIS — Z8739 Personal history of other diseases of the musculoskeletal system and connective tissue: Secondary | ICD-10-CM | POA: Insufficient documentation

## 2012-11-05 MED ORDER — PROMETHAZINE HCL 25 MG PO TABS: 25.0000 mg | ORAL_TABLET | Freq: Four times a day (QID) | ORAL | Status: DC | PRN

## 2012-11-05 MED ORDER — OXYCODONE-ACETAMINOPHEN 5-325 MG PO TABS
2.0000 | ORAL_TABLET | Freq: Once | ORAL | Status: AC
  Administered 2012-11-05: 2 via ORAL
  Filled 2012-11-05: qty 2

## 2012-11-05 MED ORDER — CYCLOBENZAPRINE HCL 10 MG PO TABS
10.0000 mg | ORAL_TABLET | Freq: Once | ORAL | Status: AC
  Administered 2012-11-05: 10 mg via ORAL
  Filled 2012-11-05: qty 1

## 2012-11-05 MED ORDER — OXYCODONE-ACETAMINOPHEN 5-325 MG PO TABS: ORAL_TABLET | ORAL | Status: DC

## 2012-11-05 MED ORDER — CYCLOBENZAPRINE HCL 10 MG PO TABS: 10.0000 mg | ORAL_TABLET | Freq: Two times a day (BID) | ORAL | Status: DC | PRN

## 2012-11-05 NOTE — ED Notes (Signed)
Pt c/o R knee pain and swelling since moving from upstairs apt to downstairs.  Hx of "torn cartilage" to R knee.  No warmth or redness, but swelling noted.

## 2012-11-05 NOTE — ED Provider Notes (Signed)
History     CSN: [id number]  Arrival date & time 11/05/12  1120   First MD Initiated Contact with Patient 11/05/12 1137      Chief Complaint  Patient presents with  . Knee Pain    (Consider location/radiation/quality/duration/timing/severity/associated sxs/prior treatment) HPI  Katherine Hartman is a 62 y.o. female complaining of pain and swelling to right knee worsening over the course last 3 days. Patient has had this pain after she recently moved and was going up and down stairs. There is no direct trauma she recalls. Patient has history of arthritis and torn meniscus to the affected knee. Patient denies numbness, paresthesia, weakness. Pain is rated at 10 out of 10, but is exacerbated by movement and weightbearing. She's been taking ibuprofen and Goody powder with little relief.  Past Medical History  Diagnosis Date  . Arthritis     Past Surgical History  Procedure Laterality Date  . Abdominal hysterectomy    . Tubal ligation      Family History  Problem Relation Age of Onset  . Cancer Mother   . Hypertension Mother   . Diabetes Mother     History  Substance Use Topics  . Smoking status: Current Every Day Smoker -- 0.10 packs/day    Types: Cigarettes  . Smokeless tobacco: Current User    Types: Chew  . Alcohol Use: No    OB History   Grav Para Term Preterm Abortions TAB SAB Ect Mult Living                  Review of Systems  Constitutional: Negative for fever.  Respiratory: Negative for shortness of breath.   Cardiovascular: Negative for chest pain.  Gastrointestinal: Negative for nausea, vomiting, abdominal pain and diarrhea.  Musculoskeletal: Positive for joint swelling and arthralgias.  All other systems reviewed and are negative.    Allergies  Nsaids  Home Medications   Current Outpatient Rx  Name  Route  Sig  Dispense  Refill  . Multiple Vitamin (MULTIVITAMIN WITH MINERALS) TABS   Oral   Take 1 tablet by mouth daily.           BP  177/101  Pulse 103  Temp(Src) 98.2 F (36.8 C) (Oral)  Resp 18  SpO2 100%  Physical Exam  Nursing note and vitals reviewed. Constitutional: She is oriented to person, place, and time. She appears well-developed and well-nourished. No distress.  HENT:  Head: Normocephalic.  Eyes: Conjunctivae and EOM are normal.  Cardiovascular: Normal rate.   Pulmonary/Chest: Effort normal. No stridor.  Musculoskeletal: Normal range of motion.  Strength is 5 out of 5x4 extremities, patient ambulates with a coordinated but antalgic gait, right knee shows trace swelling and erythema with no warmth, positive  tenderness to palpation of the lateral joint line, there is crepitus, anterior and posterior drawer are negative, joint is stable to valgus and varus stress. History painful for the patient to fully extend the knee.   Neurovascularly intact   Neurological: She is alert and oriented to person, place, and time.  Psychiatric: She has a normal mood and affect.    ED Course  Procedures (including critical care time)  Labs Reviewed - No data to display Dg Knee Complete 4 Views Right  11/05/2012  *RADIOLOGY REPORT*  Clinical Data: Pain after climbing.  RIGHT KNEE - COMPLETE 4+ VIEW  Comparison: 04/08/2012  Findings: Three compartment osteoarthritis, grossly similar to 04/08/2012.  Most severe in the lateral and patellofemoral compartments. No acute fracture  or dislocation.  No joint effusion.  IMPRESSION: Degenerative change, without acute osseous finding.   Original Report Authenticated By: Jeronimo Greaves, M.D.      1. Chronic knee pain, right       MDM   Katherine Hartman is a 62 y.o. female severe exacerbation chronic right knee pain. Patient is able to ambulate however, I suspect that there is a medical injury. X-ray shows no bony abnormalities. Patient is uninsured and has a difficulty following up with specialist due to the expense. I will put her in a knee immobilizer. I have offered crutches  which he refuses. She states she has a walker home. Aggressive pain control and recommend RICE.    Filed Vitals:   11/05/12 1132  BP: 177/101  Pulse: 103  Temp: 98.2 F (36.8 C)  TempSrc: Oral  Resp: 18  SpO2: 100%     Pt verbalized understanding and agrees with care plan. Outpatient follow-up and return precautions given.    Discharge Medication List as of 11/05/2012  1:18 PM    START taking these medications   Details  cyclobenzaprine (FLEXERIL) 10 MG tablet Take 1 tablet (10 mg total) by mouth 2 (two) times daily as needed for muscle spasms., Starting 11/05/2012, Until Discontinued, Print    oxyCODONE-acetaminophen (PERCOCET/ROXICET) 5-325 MG per tablet 1 to 2 tabs PO q6hrs  PRN for pain, Print    promethazine (PHENERGAN) 25 MG tablet Take 1 tablet (25 mg total) by mouth every 6 (six) hours as needed for nausea., Starting 11/05/2012, Until Discontinued, [employer], PA-C 11/05/12 1644

## 2012-11-05 NOTE — ED Notes (Signed)
Right knee pain x 2-3 days since moving from upstairs apt to downstairs.

## 2012-11-05 NOTE — ED Notes (Signed)
Pt states she has a knee immobilizer at home and does not want another at this time.

## 2012-11-07 NOTE — ED Provider Notes (Signed)
Medical screening examination/treatment/procedure(s) were performed by non-physician practitioner and as supervising physician I was immediately available for consultation/collaboration.   Aundraya Dripps M Gareld Obrecht, DO 11/07/12 1149 

## 2012-11-23 ENCOUNTER — Emergency Department: Payer: Self-pay | Admitting: Emergency Medicine

## 2012-12-01 ENCOUNTER — Emergency Department: Payer: Self-pay | Admitting: Emergency Medicine

## 2012-12-03 ENCOUNTER — Encounter (HOSPITAL_COMMUNITY): Payer: Self-pay | Admitting: Emergency Medicine

## 2012-12-03 ENCOUNTER — Emergency Department (HOSPITAL_COMMUNITY)
Admission: EM | Admit: 2012-12-03 | Discharge: 2012-12-03 | Disposition: A | Discharge status: Home / Self Care | Payer: Self-pay | Attending: Emergency Medicine | Admitting: Emergency Medicine

## 2012-12-03 DIAGNOSIS — M129 Arthropathy, unspecified: Secondary | ICD-10-CM | POA: Insufficient documentation

## 2012-12-03 DIAGNOSIS — L02219 Cutaneous abscess of trunk, unspecified: Secondary | ICD-10-CM | POA: Insufficient documentation

## 2012-12-03 DIAGNOSIS — R110 Nausea: Secondary | ICD-10-CM | POA: Insufficient documentation

## 2012-12-03 DIAGNOSIS — L0291 Cutaneous abscess, unspecified: Secondary | ICD-10-CM

## 2012-12-03 DIAGNOSIS — Z79899 Other long term (current) drug therapy: Secondary | ICD-10-CM | POA: Insufficient documentation

## 2012-12-03 DIAGNOSIS — F17200 Nicotine dependence, unspecified, uncomplicated: Secondary | ICD-10-CM | POA: Insufficient documentation

## 2012-12-03 MED ORDER — OXYCODONE-ACETAMINOPHEN 5-325 MG PO TABS: ORAL_TABLET | ORAL | Status: DC

## 2012-12-03 MED ORDER — LORAZEPAM 1 MG PO TABS
1.0000 mg | ORAL_TABLET | Freq: Once | ORAL | Status: AC
  Administered 2012-12-03: 1 mg via ORAL
  Filled 2012-12-03: qty 1

## 2012-12-03 MED ORDER — ACETAMINOPHEN 500 MG PO TABS: 500.0000 mg | ORAL_TABLET | Freq: Four times a day (QID) | ORAL | Status: DC | PRN

## 2012-12-03 MED ORDER — OXYCODONE-ACETAMINOPHEN 5-325 MG PO TABS
2.0000 | ORAL_TABLET | Freq: Once | ORAL | Status: AC
  Administered 2012-12-03: 2 via ORAL
  Filled 2012-12-03: qty 2

## 2012-12-03 NOTE — ED Provider Notes (Signed)
History    This chart was scribed for non-physician practitioner Junius Finner working with Gerhard Munch, MD by Quintella Reichert, ED Scribe. This patient was seen in room TR07C/TR07C and the patient's care was started at 9:14 PM .   CSN: [id number]  Arrival date & time 12/03/12  1851      Chief Complaint  Patient presents with  . Abscess     The history is provided by the patient. No language interpreter was used.    HPI Comments: Katherine Hartman is a 62 y.o. female who presents to the Emergency Department complaining of abscess on her lower pubic area.  Pt came to ED 2 days ago and had abscess lanced, but states that abscess is still very painful.  She was put on Bactrim, and reports mild associated nausea.  She also notes a burning sensation on her right thigh.  Pt denies fever, rashes, emesis, abdominal pain or diarrhea.  She states that she is not driving.  Reports last time she had it lanced they gave her morphine and ativan before starting the procedure.   Past Medical History  Diagnosis Date  . Arthritis     Past Surgical History  Procedure Laterality Date  . Abdominal hysterectomy    . Tubal ligation      Family History  Problem Relation Age of Onset  . Cancer Mother   . Hypertension Mother   . Diabetes Mother     History  Substance Use Topics  . Smoking status: Current Every Day Smoker -- 0.10 packs/day    Types: Cigarettes  . Smokeless tobacco: Current User    Types: Chew  . Alcohol Use: No    OB History   Grav Para Term Preterm Abortions TAB SAB Ect Mult Living                  Review of Systems  Gastrointestinal: Negative for vomiting, abdominal pain and diarrhea.  Skin: Negative for rash.       Abscess  All other systems reviewed and are negative.    Allergies  Nsaids  Home Medications   Current Outpatient Rx  Name  Route  Sig  Dispense  Refill  . diphenhydrAMINE (BENADRYL) 25 mg capsule   Oral   Take 25 mg by mouth every 6 (six)  hours as needed for itching.         . Multiple Vitamin (MULTIVITAMIN WITH MINERALS) TABS   Oral   Take 1 tablet by mouth daily.         Marland Kitchen sulfamethoxazole-trimethoprim (BACTRIM DS,SEPTRA DS) 800-160 MG per tablet   Oral   Take 1 tablet by mouth 2 (two) times daily.         Marland Kitchen acetaminophen (TYLENOL) 500 MG tablet   Oral   Take 1 tablet (500 mg total) by mouth every 6 (six) hours as needed for pain.   30 tablet   0   . oxyCODONE-acetaminophen (PERCOCET/ROXICET) 5-325 MG per tablet      Take 1-2 tabs every 6 hours as needed for pain.   6 tablet   0     BP 176/99  Pulse 98  Temp(Src) 98.2 F (36.8 C) (Oral)  Resp 22  SpO2 99%  Physical Exam  Nursing note and vitals reviewed. Constitutional: She is oriented to person, place, and time. She appears well-developed and well-nourished. No distress.  HENT:  Head: Normocephalic and atraumatic.  Eyes: EOM are normal.  Neck: Normal range of motion. Neck supple.  No tracheal deviation present.  Cardiovascular: Normal rate.   Pulmonary/Chest: Effort normal. No respiratory distress.  Abdominal: Soft. There is tenderness (suprapubic).  Musculoskeletal: Normal range of motion.  Neurological: She is alert and oriented to person, place, and time.  Skin: Skin is warm and dry. There is erythema ( 15 x 10cm).  Abscess in suprapubic region with packing placed, erythematous, scant purulent drainage, warm to touch. Induration 9x7cm  Psychiatric: She has a normal mood and affect. Her behavior is normal.    ED Course  INCISION AND DRAINAGE Date/Time: 12/04/2012 5:04 PM Performed by: Junius Finner Authorized by: Junius Finner Consent: Verbal consent obtained. Risks and benefits: risks, benefits and alternatives were discussed Consent given by: patient Patient understanding: patient states understanding of the procedure being performed Patient consent: the patient's understanding of the procedure matches consent given Procedure  consent: procedure consent matches procedure scheduled Patient identity confirmed: verbally with patient Type: abscess Body area: trunk Location details: abdomen Anesthesia: local infiltration Local anesthetic: lidocaine 2% without epinephrine Anesthetic total: 10 ml Patient sedated: no Scalpel size: 11 Needle gauge: 25. Incision type: single straight Drainage: bloody Drainage amount: scant Wound treatment: wound left open Packing material: 1/4 in iodoform gauze Patient tolerance: Patient tolerated the procedure well with no immediate complications.   (including critical care time)  DIAGNOSTIC STUDIES: Oxygen Saturation is 99% on room air, normal by my interpretation.    COORDINATION OF CARE: 9:17 PM-Discussed treatment plan which includes draining abscess, Ativan, and pain medication with pt at bedside and pt agreed to plan.      Labs Reviewed - No data to display No results found.   1. Abscess and cellulitis       MDM  Pt presented for wound recheck of drained suprapubic abscess.  Packing was placed and pt was advised to return in 2 days for wound recheck.  She has been taking bactrim for possible MRSA.  Pt reports minimal decrease in size from 2 days ago.  Advised pt it needed to be drained some more.  Some loculations were able to be broken up but only scant bloody drainage achieved.  Larger incision made and loose packing replaced (see procedure note).  Discussed pt with Dr. Jeraldine Loots who agreed pt may try another 48hrs of conservative care with out pt anbx.  May need IV antibiotics if abscess does not more rapidly over the next 2 days.  Body marker used to outine erythemic area.  Pt informed to return in 48hrs for packing removal and recheck, sooner if red area goes beyond marker line or if pt spikes a fever and begins to vomit.     Rx: percocet and acetaminophen   Continue to take bactrim.  Strict return precautions given. F/u in 2 days for wound recheck.   I  personally performed the services described in this documentation, which was scribed in my presence. The recorded information has been reviewed and is accurate.   Vitals: unremarkable. Discharged in stable condition.    Discussed pt with attending during ED encounter.     Junius Finner, PA-C 12/04/12 1712

## 2012-12-03 NOTE — ED Notes (Signed)
Pt has an abscess on her lower pubic area. Pt states she noticed her abscess first Sunday morning. Pt states she had the abscess lanced and drained on Tuesday. Pt states she started taking an antibiotic two days ago. Pt states the abscess is still very tender and her pain is still awful. Pt states she was told to come back in two days to get her abscess unpacked and drained more.

## 2012-12-04 NOTE — ED Provider Notes (Signed)
  Medical screening examination/treatment/procedure(s) were performed by non-physician practitioner and as supervising physician I was immediately available for consultation/collaboration.    Gerhard Munch, MD 12/04/12 [phone]

## 2013-02-02 ENCOUNTER — Encounter (HOSPITAL_COMMUNITY): Payer: Self-pay | Admitting: Emergency Medicine

## 2013-02-02 ENCOUNTER — Emergency Department (HOSPITAL_COMMUNITY)
Admission: EM | Admit: 2013-02-02 | Discharge: 2013-02-02 | Disposition: A | Discharge status: Home / Self Care | Payer: Self-pay | Attending: Emergency Medicine | Admitting: Emergency Medicine

## 2013-02-02 DIAGNOSIS — M25561 Pain in right knee: Secondary | ICD-10-CM

## 2013-02-02 DIAGNOSIS — F17200 Nicotine dependence, unspecified, uncomplicated: Secondary | ICD-10-CM | POA: Insufficient documentation

## 2013-02-02 DIAGNOSIS — Z79899 Other long term (current) drug therapy: Secondary | ICD-10-CM | POA: Insufficient documentation

## 2013-02-02 DIAGNOSIS — M25569 Pain in unspecified knee: Secondary | ICD-10-CM | POA: Insufficient documentation

## 2013-02-02 DIAGNOSIS — R52 Pain, unspecified: Secondary | ICD-10-CM | POA: Insufficient documentation

## 2013-02-02 DIAGNOSIS — M1710 Unilateral primary osteoarthritis, unspecified knee: Secondary | ICD-10-CM | POA: Insufficient documentation

## 2013-02-02 DIAGNOSIS — R51 Headache: Secondary | ICD-10-CM | POA: Insufficient documentation

## 2013-02-02 MED ORDER — TRAMADOL HCL 50 MG PO TABS: 50.0000 mg | ORAL_TABLET | Freq: Four times a day (QID) | ORAL | Status: DC | PRN

## 2013-02-02 MED ORDER — HYDROCODONE-ACETAMINOPHEN 5-325 MG PO TABS
2.0000 | ORAL_TABLET | Freq: Once | ORAL | Status: AC
  Administered 2013-02-02: 2 via ORAL
  Filled 2013-02-02: qty 2

## 2013-02-02 MED ORDER — METHOCARBAMOL 500 MG PO TABS: 500.0000 mg | ORAL_TABLET | Freq: Two times a day (BID) | ORAL | Status: DC

## 2013-02-02 NOTE — ED Notes (Signed)
Pt reports right knee pain due to arthritis. Today her knee gave out on her and concerned it is time for her to have knee replacement.

## 2013-02-02 NOTE — ED Provider Notes (Signed)
History  This chart was scribed for Katherine Hartman - PA by Manuela Schwartz, ED scribe. This patient was seen in room TR10C/TR10C and the patient's care was started at 1739.  CSN: [id number] Arrival date & time 02/02/13  1704  First MD Initiated Contact with Patient 02/02/13 1739     Chief Complaint  Patient presents with  . Knee Pain    right   The history is provided by the patient. No language interpreter was used.   HPI Comments: Katherine Hartman is a 62 y.o. female who presents to the Emergency Department w/hx of arthritis complaining of constant, moderate to severe, right knee pain, onset 1 day ago after she states her knee gave out while she was walking yesterday. She states has tried taking tylenol at home w/out improvement in her knee pain. She states pain is worse w/ambulation secondary to pain. She states her BP seemed higher than normal at triage and denies hx of HTN. She states a mild HA, and denies dizziness.  Past Medical History  Diagnosis Date  . Arthritis    Past Surgical History  Procedure Laterality Date  . Abdominal hysterectomy    . Tubal ligation     Family History  Problem Relation Age of Onset  . Cancer Mother   . Hypertension Mother   . Diabetes Mother    History  Substance Use Topics  . Smoking status: Current Every Day Smoker -- 0.10 packs/day    Types: Cigarettes  . Smokeless tobacco: Current User    Types: Chew  . Alcohol Use: No   OB History   Grav Para Term Preterm Abortions TAB SAB Ect Mult Living                 Review of Systems  Constitutional: Negative for fever and chills.  HENT: Negative for nosebleeds and congestion.   Respiratory: Negative for shortness of breath.   Cardiovascular: Negative for chest pain.  Gastrointestinal: Negative for nausea, vomiting and abdominal pain.  Musculoskeletal: Positive for joint swelling (mild right knee swellling). Negative for back pain.       Right knee pain  Neurological: Positive for headaches.  Negative for weakness.  All other systems reviewed and are negative.   A complete 10 system review of systems was obtained and all systems are negative except as noted in the HPI and PMH.   Allergies  Nsaids  Home Medications   Current Outpatient Rx  Name  Route  Sig  Dispense  Refill  . acetaminophen (TYLENOL) 500 MG tablet   Oral   Take 1 tablet (500 mg total) by mouth every 6 (six) hours as needed for pain.   30 tablet   0   . diphenhydrAMINE (BENADRYL) 25 mg capsule   Oral   Take 25 mg by mouth every 6 (six) hours as needed for itching.         . Multiple Vitamin (MULTIVITAMIN WITH MINERALS) TABS   Oral   Take 1 tablet by mouth daily.         . methocarbamol (ROBAXIN) 500 MG tablet   Oral   Take 1 tablet (500 mg total) by mouth 2 (two) times daily.   20 tablet   0   . traMADol (ULTRAM) 50 MG tablet   Oral   Take 1 tablet (50 mg total) by mouth every 6 (six) hours as needed for pain.   15 tablet   0    Triage Vitals: BP 178/101  Pulse  91  Temp(Src) 98 F (36.7 C) (Oral)  Resp 20  SpO2 100% Physical Exam  Nursing note and vitals reviewed. Constitutional: She is oriented to person, place, and time. She appears well-developed and well-nourished. No distress.  HENT:  Head: Normocephalic and atraumatic.  Eyes: EOM are normal.  Neck: Neck supple. No tracheal deviation present.  Cardiovascular: Normal rate, regular rhythm and normal heart sounds.   No murmur heard. Pulmonary/Chest: Effort normal and breath sounds normal. No respiratory distress. She has no wheezes. She has no rales.  Musculoskeletal: Normal range of motion. She exhibits tenderness.  Right knee mildly tender to palpation over the medial and lateral aspects. No bony deformities or abnormalities. ROM and strength intact. Pt able to ambulate.  Neurological: She is alert and oriented to person, place, and time.  Skin: Skin is warm and dry.  Psychiatric: She has a normal mood and affect. Her  behavior is normal.    ED Course  Procedures (including critical care time) DIAGNOSTIC STUDIES: Oxygen Saturation is 100% on room air, normal by my interpretation.    COORDINATION OF CARE: At 620 PM Discussed treatment plan with patient which includes right Knee X-ray, pain medicine. Patient agrees.   Labs Reviewed - No data to display No results found. 1. Knee pain, acute, right     MDM  Patient with chronic right knee pain. Will treat pain in the emergency department, discharge with tramadol. Recommend ice and rest. The patient's blood pressure is elevated, but she has not taken any pressure medications. I advised followup with primary care provider. States that she has a mild headache, but does not have any dizziness, visual complaints or numbness or tingling or weakness of her extremities.   Katherine Horseman, PA-C 02/02/13 1902

## 2013-02-03 NOTE — ED Provider Notes (Signed)
Medical screening examination/treatment/procedure(s) were performed by non-physician practitioner and as supervising physician I was immediately available for consultation/collaboration.  Doug Sou, MD 02/03/13 [phone]

## 2013-05-16 ENCOUNTER — Emergency Department: Payer: Self-pay | Admitting: Internal Medicine

## 2014-04-14 ENCOUNTER — Emergency Department: Payer: Self-pay | Admitting: Emergency Medicine

## 2014-04-14 LAB — CBC WITH DIFFERENTIAL/PLATELET
BASOS PCT: 0.6 %
Basophil #: 0.1 10*3/uL (ref 0.0–0.1)
EOS ABS: 0.2 10*3/uL (ref 0.0–0.7)
EOS PCT: 2.5 %
HCT: 39.7 % (ref 35.0–47.0)
HGB: 12.5 g/dL (ref 12.0–16.0)
LYMPHS ABS: 3.1 10*3/uL (ref 1.0–3.6)
LYMPHS PCT: 32.2 %
MCH: 25.6 pg — ABNORMAL LOW (ref 26.0–34.0)
MCHC: 31.4 g/dL — ABNORMAL LOW (ref 32.0–36.0)
MCV: 82 fL (ref 80–100)
MONOS PCT: 5.8 %
Monocyte #: 0.6 x10 3/mm (ref 0.2–0.9)
NEUTROS PCT: 58.9 %
Neutrophil #: 5.7 10*3/uL (ref 1.4–6.5)
Platelet: 266 10*3/uL (ref 150–440)
RBC: 4.87 10*6/uL (ref 3.80–5.20)
RDW: 16.2 % — AB (ref 11.5–14.5)
WBC: 9.7 10*3/uL (ref 3.6–11.0)

## 2014-04-14 LAB — COMPREHENSIVE METABOLIC PANEL
ALT: 15 U/L
ANION GAP: 7 (ref 7–16)
Albumin: 3.2 g/dL — ABNORMAL LOW (ref 3.4–5.0)
Alkaline Phosphatase: 113 U/L
BILIRUBIN TOTAL: 0.3 mg/dL (ref 0.2–1.0)
BUN: 21 mg/dL — AB (ref 7–18)
CALCIUM: 9.5 mg/dL (ref 8.5–10.1)
CHLORIDE: 105 mmol/L (ref 98–107)
CO2: 25 mmol/L (ref 21–32)
Creatinine: 0.97 mg/dL (ref 0.60–1.30)
EGFR (Non-African Amer.): >60
Glucose: 83 mg/dL (ref 65–99)
OSMOLALITY: 276 (ref 275–301)
Potassium: 5 mmol/L (ref 3.5–5.1)
SGOT(AST): 30 U/L (ref 15–37)
Sodium: 137 mmol/L (ref 136–145)
TOTAL PROTEIN: 8 g/dL (ref 6.4–8.2)

## 2014-04-14 LAB — SEDIMENTATION RATE: Erythrocyte Sed Rate: 35 mm/hr — ABNORMAL HIGH (ref 0–30)

## 2014-04-14 LAB — URINALYSIS, COMPLETE
BILIRUBIN, UR: NEGATIVE
BLOOD: NEGATIVE
Bacteria: NONE SEEN
GLUCOSE, UR: NEGATIVE mg/dL (ref 0–75)
KETONE: NEGATIVE
LEUKOCYTE ESTERASE: NEGATIVE
Nitrite: NEGATIVE
PROTEIN: NEGATIVE
Ph: 7 (ref 4.5–8.0)
RBC,UR: NONE SEEN /HPF (ref 0–5)
SPECIFIC GRAVITY: 1.011 (ref 1.003–1.030)
WBC UR: 1 /HPF (ref 0–5)

## 2014-04-14 LAB — TROPONIN I
TROPONIN-I: 0.04 ng/mL
TROPONIN-I: 0.04 ng/mL

## 2014-04-14 LAB — TSH: Thyroid Stimulating Horm: 1.38 u[IU]/mL

## 2014-07-10 ENCOUNTER — Emergency Department: Payer: Self-pay | Admitting: Emergency Medicine

## 2014-10-12 ENCOUNTER — Emergency Department: Payer: Self-pay | Admitting: Internal Medicine

## 2015-01-11 ENCOUNTER — Emergency Department
Admission: EM | Admit: 2015-01-11 | Discharge: 2015-01-11 | Disposition: A | Discharge status: Home / Self Care | Payer: Medicare Other | Attending: Emergency Medicine | Admitting: Emergency Medicine

## 2015-01-11 ENCOUNTER — Encounter: Payer: Self-pay | Admitting: General Practice

## 2015-01-11 ENCOUNTER — Emergency Department: Payer: Medicare Other

## 2015-01-11 DIAGNOSIS — M179 Osteoarthritis of knee, unspecified: Secondary | ICD-10-CM | POA: Diagnosis not present

## 2015-01-11 DIAGNOSIS — Z720 Tobacco use: Secondary | ICD-10-CM | POA: Insufficient documentation

## 2015-01-11 DIAGNOSIS — M1712 Unilateral primary osteoarthritis, left knee: Secondary | ICD-10-CM | POA: Insufficient documentation

## 2015-01-11 DIAGNOSIS — S8992XA Unspecified injury of left lower leg, initial encounter: Secondary | ICD-10-CM | POA: Diagnosis not present

## 2015-01-11 DIAGNOSIS — Z79899 Other long term (current) drug therapy: Secondary | ICD-10-CM | POA: Diagnosis not present

## 2015-01-11 DIAGNOSIS — M25562 Pain in left knee: Secondary | ICD-10-CM | POA: Diagnosis not present

## 2015-01-11 MED ORDER — TRAMADOL HCL 50 MG PO TABS: 50.0000 mg | ORAL_TABLET | Freq: Four times a day (QID) | ORAL | Status: DC | PRN

## 2015-01-11 MED ORDER — METHOCARBAMOL 500 MG PO TABS
ORAL_TABLET | ORAL | Status: AC
  Administered 2015-01-11: 1000 mg via ORAL
  Filled 2015-01-11: qty 2

## 2015-01-11 MED ORDER — METHOCARBAMOL 500 MG PO TABS
1000.0000 mg | ORAL_TABLET | Freq: Once | ORAL | Status: AC
Start: 2015-01-11 — End: 2015-01-11
  Administered 2015-01-11: 1000 mg via ORAL

## 2015-01-11 MED ORDER — CYCLOBENZAPRINE HCL 10 MG PO TABS: 10.0000 mg | ORAL_TABLET | Freq: Three times a day (TID) | ORAL | Status: DC | PRN

## 2015-01-11 MED ORDER — OXYCODONE-ACETAMINOPHEN 5-325 MG PO TABS: 1.0000 | ORAL_TABLET | Freq: Four times a day (QID) | ORAL | Status: DC | PRN

## 2015-01-11 MED ORDER — OXYCODONE HCL 5 MG PO TABS
5.0000 mg | ORAL_TABLET | Freq: Once | ORAL | Status: AC
  Administered 2015-01-11: 5 mg via ORAL

## 2015-01-11 MED ORDER — OXYCODONE HCL 5 MG PO TABS
ORAL_TABLET | ORAL | Status: AC
  Administered 2015-01-11: 5 mg via ORAL
  Filled 2015-01-11: qty 1

## 2015-01-11 MED ORDER — METHOCARBAMOL 500 MG PO TABS: 1000.0000 mg | ORAL_TABLET | Freq: Once | ORAL | Status: DC

## 2015-01-11 NOTE — ED Provider Notes (Signed)
[facility] Emergency Department Provider Note ____________________________________________  Time seen: Approximately 4:56 PM  I have reviewed the triage vital signs and the nursing notes.   HISTORY  Chief Complaint Knee Pain   HPI Katherine Hartman is a 64 y.o. female who presents to the emergency department for 3 days of left knee pain. She states that she was walking in her house her right knee gave out, and she hit the left knee on the edge of the chair. She states that the pain has been increasing over the past 3 days and couldn't take it anymore so she came to the emergency department today. She has not taken anything for pain over-the-counter.   Past Medical History  Diagnosis Date  . Arthritis     There are no active problems to display for this patient.   Past Surgical History  Procedure Laterality Date  . Abdominal hysterectomy    . Tubal ligation      Current Outpatient Rx  Name  Route  Sig  Dispense  Refill  . acetaminophen (TYLENOL) 500 MG tablet   Oral   Take 1 tablet (500 mg total) by mouth every 6 (six) hours as needed for pain.   30 tablet   0   . diphenhydrAMINE (BENADRYL) 25 mg capsule   Oral   Take 25 mg by mouth every 6 (six) hours as needed for itching.         . methocarbamol (ROBAXIN) 500 MG tablet   Oral   Take 2 tablets (1,000 mg total) by mouth once.   30 tablet   0   . Multiple Vitamin (MULTIVITAMIN WITH MINERALS) TABS   Oral   Take 1 tablet by mouth daily.         . traMADol (ULTRAM) 50 MG tablet   Oral   Take 1 tablet (50 mg total) by mouth every 6 (six) hours as needed.   9 tablet   0     Allergies Nsaids  Family History  Problem Relation Age of Onset  . Cancer Mother   . Hypertension Mother   . Diabetes Mother     Social History History  Substance Use Topics  . Smoking status: Current Every Day Smoker -- 0.10 packs/day    Types: Cigarettes  . Smokeless tobacco: Current User    Types:  Chew  . Alcohol Use: No    Review of Systems Constitutional: No recent illness. Eyes: No visual changes. ENT: No sore throat. Cardiovascular: Denies chest pain or palpitations. Respiratory: Denies shortness of breath. Gastrointestinal: No abdominal pain.  Genitourinary: Negative for dysuria. Musculoskeletal: Pain and swelling in left knee. Skin: Negative for rash. Neurological: Negative for headaches, focal weakness or numbness. 10-point ROS otherwise negative.  ____________________________________________   PHYSICAL EXAM:  VITAL SIGNS: ED Triage Vitals  Enc Vitals Group     BP 01/11/15 1456 147/92 mmHg     Pulse Rate 01/11/15 1456 95     Resp 01/11/15 1456 18     Temp 01/11/15 1456 98.7 F (37.1 C)     Temp Source 01/11/15 1456 Oral     SpO2 01/11/15 1456 97 %     Weight 01/11/15 1456 210 lb (95.255 kg)     Height 01/11/15 1456 5\' 9"  (1.753 m)     Head Cir --      Peak Flow --      Pain Score 01/11/15 1456 10     Pain Loc --  Pain Edu? --      Excl. in [place]? --     Constitutional: Alert and oriented. Well appearing and in no acute distress. Eyes: Conjunctivae are normal. EOMI. Head: Atraumatic. Nose: No congestion/rhinnorhea. Neck: No stridor.  Respiratory: Normal respiratory effort.   Musculoskeletal: Mild swelling/effusion over her lateral left knee. Range of motion is possible but painful. Neurologic:  Normal speech and language. No gross focal neurologic deficits are appreciated. Speech is normal. Antalgic gait noted, but stable. Skin:  Skin is warm, dry and intact. Atraumatic. Psychiatric: Mood and affect are normal. Speech and behavior are normal.  ____________________________________________   LABS (all labs ordered are listed, but only abnormal results are displayed)  Labs Reviewed - No data to display ____________________________________________  RADIOLOGY  Left knee negative for acute abnormality. Osteoarthritis  present. ____________________________________________   PROCEDURES  Procedure(s) performed: Knee immobilizer applied by tech. Neurovascularly intact post-splint application.   ____________________________________________   INITIAL IMPRESSION / ASSESSMENT AND PLAN / ED COURSE  Pertinent labs & imaging results that were available during my care of the patient were reviewed by me and considered in my medical decision making (see chart for details).  She was advised to follow-up with orthopedics. She was advised to return to the emergency department for symptoms that change or worsen if she is unable schedule an appointment with primary care orthopedics. ____________________________________________   FINAL CLINICAL IMPRESSION(S) / ED DIAGNOSES  Final diagnoses:  Osteoarthritis of left knee, unspecified osteoarthritis type      Victorino Dike, FNP 01/11/15 1843  Harvest Dark, MD 01/11/15 2306

## 2015-01-11 NOTE — Discharge Instructions (Signed)
Follow up with orthopedic doctor. Return to the ER for symptoms that change or worsen if you are unable to schedule an appointment with primary care or the specialist.

## 2015-01-11 NOTE — ED Notes (Signed)
Pt. Arrived to ed from home with reports of experiencing left knee pain x three days. Pt reports pain started after hitting left knee on chair. Pt ambulatory. Reports increase swelling to site. Pt alert and oriented. NO acute distress noted at this time.

## 2015-02-09 DIAGNOSIS — M170 Bilateral primary osteoarthritis of knee: Secondary | ICD-10-CM | POA: Diagnosis not present

## 2015-02-22 ENCOUNTER — Ambulatory Visit
Admission: RE | Admit: 2015-02-22 | Discharge: 2015-02-22 | Disposition: A | Discharge status: Home / Self Care | Payer: Medicare Other | Source: Ambulatory Visit | Attending: Orthopedic Surgery | Admitting: Orthopedic Surgery

## 2015-02-22 ENCOUNTER — Encounter
Admission: RE | Admit: 2015-02-22 | Discharge: 2015-02-22 | Disposition: A | Discharge status: Home / Self Care | Payer: Medicare Other | Source: Ambulatory Visit | Attending: Orthopedic Surgery | Admitting: Orthopedic Surgery

## 2015-02-22 DIAGNOSIS — M79606 Pain in leg, unspecified: Secondary | ICD-10-CM | POA: Insufficient documentation

## 2015-02-22 DIAGNOSIS — F17200 Nicotine dependence, unspecified, uncomplicated: Secondary | ICD-10-CM

## 2015-02-22 DIAGNOSIS — F17210 Nicotine dependence, cigarettes, uncomplicated: Secondary | ICD-10-CM | POA: Diagnosis not present

## 2015-02-22 DIAGNOSIS — I10 Essential (primary) hypertension: Secondary | ICD-10-CM | POA: Insufficient documentation

## 2015-02-22 DIAGNOSIS — Z720 Tobacco use: Secondary | ICD-10-CM | POA: Insufficient documentation

## 2015-02-22 DIAGNOSIS — Z01818 Encounter for other preprocedural examination: Secondary | ICD-10-CM | POA: Diagnosis not present

## 2015-02-22 HISTORY — DX: Essential (primary) hypertension: I10

## 2015-02-22 LAB — URINALYSIS COMPLETE WITH MICROSCOPIC (ARMC ONLY)
BILIRUBIN URINE: NEGATIVE
Bacteria, UA: NONE SEEN
GLUCOSE, UA: NEGATIVE mg/dL
Ketones, ur: NEGATIVE mg/dL
NITRITE: NEGATIVE
PH: 5 (ref 5.0–8.0)
PROTEIN: NEGATIVE mg/dL
SPECIFIC GRAVITY, URINE: 1.021 (ref 1.005–1.030)

## 2015-02-22 LAB — TYPE AND SCREEN
ABO/RH(D): B POS
Antibody Screen: NEGATIVE

## 2015-02-22 LAB — DIFFERENTIAL
BASOS PCT: 0 %
Basophils Absolute: 0 10*3/uL (ref 0–0.1)
Eosinophils Absolute: 0.1 10*3/uL (ref 0–0.7)
Eosinophils Relative: 1 %
LYMPHS ABS: 2.9 10*3/uL (ref 1.0–3.6)
Lymphocytes Relative: 36 %
Monocytes Absolute: 0.4 10*3/uL (ref 0.2–0.9)
Monocytes Relative: 5 %
Neutro Abs: 4.6 10*3/uL (ref 1.4–6.5)
Neutrophils Relative %: 58 %

## 2015-02-22 LAB — URINE DRUG SCREEN, QUALITATIVE (ARMC ONLY)
Amphetamines, Ur Screen: NOT DETECTED
BARBITURATES, UR SCREEN: NOT DETECTED
Benzodiazepine, Ur Scrn: NOT DETECTED
COCAINE METABOLITE, UR ~~LOC~~: POSITIVE — AB
Cannabinoid 50 Ng, Ur ~~LOC~~: POSITIVE — AB
MDMA (Ecstasy)Ur Screen: NOT DETECTED
Methadone Scn, Ur: NOT DETECTED
Opiate, Ur Screen: NOT DETECTED
PHENCYCLIDINE (PCP) UR S: NOT DETECTED
Tricyclic, Ur Screen: POSITIVE — AB

## 2015-02-22 LAB — BASIC METABOLIC PANEL
Anion gap: 10 (ref 5–15)
BUN: 24 mg/dL — AB (ref 6–20)
CO2: 24 mmol/L (ref 22–32)
CREATININE: 1.13 mg/dL — AB (ref 0.44–1.00)
Calcium: 9.6 mg/dL (ref 8.9–10.3)
Chloride: 99 mmol/L — ABNORMAL LOW (ref 101–111)
GFR calc Af Amer: 58 mL/min — ABNORMAL LOW (ref >60)
GFR, EST NON AFRICAN AMERICAN: 50 mL/min — AB (ref >60)
GLUCOSE: 97 mg/dL (ref 65–99)
Potassium: 4 mmol/L (ref 3.5–5.1)
Sodium: 133 mmol/L — ABNORMAL LOW (ref 135–145)

## 2015-02-22 LAB — CBC
HEMATOCRIT: 40.8 % (ref 35.0–47.0)
HEMOGLOBIN: 13.2 g/dL (ref 12.0–16.0)
MCH: 26.1 pg (ref 26.0–34.0)
MCHC: 32.4 g/dL (ref 32.0–36.0)
MCV: 80.6 fL (ref 80.0–100.0)
Platelets: 350 10*3/uL (ref 150–440)
RBC: 5.07 MIL/uL (ref 3.80–5.20)
RDW: 16.3 % — AB (ref 11.5–14.5)
WBC: 8 10*3/uL (ref 3.6–11.0)

## 2015-02-22 LAB — PREPARE RBC (CROSSMATCH)

## 2015-02-22 LAB — SURGICAL PCR SCREEN
MRSA, PCR: NEGATIVE
Staphylococcus aureus: POSITIVE — AB

## 2015-02-22 LAB — PROTIME-INR
INR: 1.03
PROTHROMBIN TIME: 13.7 s (ref 11.4–15.0)

## 2015-02-22 LAB — APTT: aPTT: 27 seconds (ref 24–36)

## 2015-02-22 LAB — ABO/RH: ABO/RH(D): B POS

## 2015-02-22 NOTE — Patient Instructions (Signed)
  Your procedure is scheduled on: Thursday 03/09/2015 Report to Day Surgery. 2ND FLOOR MEDICAL MALL ENTRANCE To find out your arrival time please call [phone] between 1PM - 3PM on Wednesday 03/08/2015.  Remember: Instructions that are not followed completely may result in serious medical risk, up to and including death, or upon the discretion of your surgeon and anesthesiologist your surgery may need to be rescheduled.    __X__ 1. Do not eat food or drink liquids after midnight. No gum chewing or hard candies.     __X__ 2. No Alcohol for 24 hours before or after surgery.   __X__ 3. Bring all medications with you on the day of surgery if instructed.    __X__ 4. Notify your doctor if there is any change in your medical condition     (cold, fever, infections).     Do not wear jewelry, make-up, hairpins, clips or nail polish OR toenail polish  Do not wear lotions, powders, or perfumes.   Do not shave 48 hours prior to surgery. Men may shave face and neck.  Do not bring valuables to the hospital.    [facility] is not responsible for any belongings or valuables.               Contacts, dentures or bridgework may not be worn into surgery.  Leave your suitcase in the car. After surgery it may be brought to your room.  For patients admitted to the hospital, discharge time is determined by your                treatment team.   Patients discharged the day of surgery will not be allowed to drive home.   Please read over the following fact sheets that you were given:   MRSA Information and Surgical Site Infection Prevention   ____ Take these medicines the morning of surgery with A SIP OF WATER:    1.   2.   3.   4.  5.  6.  ____ Fleet Enema (as directed)   __x__ Use CHG Soap as directed  ____ Use inhalers on the day of surgery  ____ Stop metformin 2 days prior to surgery    ____ Take 1/2 of usual insulin dose the night before surgery and none on the morning of surgery.    ____ Stop Coumadin/Plavix/aspirin on   ____ Stop Anti-inflammatories on    ____ Stop supplements until after surgery.    ____ Bring C-Pap to the hospital.

## 2015-02-24 NOTE — Pre-Procedure Instructions (Signed)
Cardiac Clearance requested by Dr. Ronelle Nigh. Anesthesia request for clearance called and faxed to Dr. Mack Guise office and spoke to [place]

## 2015-03-28 ENCOUNTER — Emergency Department (HOSPITAL_COMMUNITY)
Admission: EM | Admit: 2015-03-28 | Discharge: 2015-03-28 | Disposition: A | Discharge status: Home / Self Care | Payer: Medicare Other | Attending: Emergency Medicine | Admitting: Emergency Medicine

## 2015-03-28 ENCOUNTER — Encounter (HOSPITAL_COMMUNITY): Payer: Self-pay | Admitting: Emergency Medicine

## 2015-03-28 DIAGNOSIS — Z79899 Other long term (current) drug therapy: Secondary | ICD-10-CM | POA: Diagnosis not present

## 2015-03-28 DIAGNOSIS — M25562 Pain in left knee: Secondary | ICD-10-CM

## 2015-03-28 DIAGNOSIS — Z720 Tobacco use: Secondary | ICD-10-CM | POA: Insufficient documentation

## 2015-03-28 DIAGNOSIS — I10 Essential (primary) hypertension: Secondary | ICD-10-CM | POA: Diagnosis not present

## 2015-03-28 DIAGNOSIS — M1712 Unilateral primary osteoarthritis, left knee: Secondary | ICD-10-CM | POA: Insufficient documentation

## 2015-03-28 DIAGNOSIS — R451 Restlessness and agitation: Secondary | ICD-10-CM | POA: Insufficient documentation

## 2015-03-28 MED ORDER — CYCLOBENZAPRINE HCL 10 MG PO TABS: 10.0000 mg | ORAL_TABLET | Freq: Two times a day (BID) | ORAL | Status: DC | PRN

## 2015-03-28 MED ORDER — HYDROCODONE-ACETAMINOPHEN 5-325 MG PO TABS: 1.0000 | ORAL_TABLET | ORAL | Status: DC | PRN

## 2015-03-28 MED ORDER — IBUPROFEN 800 MG PO TABS: 800.0000 mg | ORAL_TABLET | Freq: Three times a day (TID) | ORAL | Status: DC

## 2015-03-28 MED ORDER — HYDROCODONE-ACETAMINOPHEN 5-325 MG PO TABS
2.0000 | ORAL_TABLET | Freq: Once | ORAL | Status: AC
  Administered 2015-03-28: 2 via ORAL
  Filled 2015-03-28: qty 2

## 2015-03-28 MED ORDER — HYDROCHLOROTHIAZIDE 12.5 MG PO TABS: 25.0000 mg | ORAL_TABLET | Freq: Every day | ORAL | Status: DC

## 2015-03-28 MED ORDER — IBUPROFEN 800 MG PO TABS: 800.0000 mg | ORAL_TABLET | Freq: Once | ORAL | Status: DC

## 2015-03-28 NOTE — ED Notes (Signed)
Pt states that she has had trouble with her L knee before and hit it this week while helping a friend move. No deformity noted. Alert and oriented.

## 2015-03-28 NOTE — ED Notes (Signed)
Pt states that the PA was going to give her a prescription for ibuprofen, but she will not wait in her room for it. She has family being seen in the department as well and states that she will come "by on her way out".

## 2015-03-28 NOTE — Discharge Instructions (Signed)
Keep your appointments with Ortho and with your new PCP for follow up on high blood pressure.    Arthritis, Nonspecific Arthritis is inflammation of a joint. This usually means pain, redness, warmth or swelling are present. One or more joints may be involved. There are a number of types of arthritis. Your caregiver may not be able to tell what type of arthritis you have right away. CAUSES  The most common cause of arthritis is the wear and tear on the joint (osteoarthritis). This causes damage to the cartilage, which can break down over time. The knees, hips, back and neck are most often affected by this type of arthritis. Other types of arthritis and common causes of joint pain include:  Sprains and other injuries near the joint. Sometimes minor sprains and injuries cause pain and swelling that develop hours later.  Rheumatoid arthritis. This affects hands, feet and knees. It usually affects both sides of your body at the same time. It is often associated with chronic ailments, fever, weight loss and general weakness.  Crystal arthritis. Gout and pseudo gout can cause occasional acute severe pain, redness and swelling in the foot, ankle, or knee.  Infectious arthritis. Bacteria can get into a joint through a break in overlying skin. This can cause infection of the joint. Bacteria and viruses can also spread through the blood and affect your joints.  Drug, infectious and allergy reactions. Sometimes joints can become mildly painful and slightly swollen with these types of illnesses. SYMPTOMS   Pain is the main symptom.  Your joint or joints can also be red, swollen and warm or hot to the touch.  You may have a fever with certain types of arthritis, or even feel overall ill.  The joint with arthritis will hurt with movement. Stiffness is present with some types of arthritis. DIAGNOSIS  Your caregiver will suspect arthritis based on your description of your symptoms and on your exam. Testing  may be needed to find the type of arthritis:  Blood and sometimes urine tests.  X-ray tests and sometimes CT or MRI scans.  Removal of fluid from the joint (arthrocentesis) is done to check for bacteria, crystals or other causes. Your caregiver (or a specialist) will numb the area over the joint with a local anesthetic, and use a needle to remove joint fluid for examination. This procedure is only minimally uncomfortable.  Even with these tests, your caregiver may not be able to tell what kind of arthritis you have. Consultation with a specialist (rheumatologist) may be helpful. TREATMENT  Your caregiver will discuss with you treatment specific to your type of arthritis. If the specific type cannot be determined, then the following general recommendations may apply. Treatment of severe joint pain includes:  Rest.  Elevation.  Anti-inflammatory medication (for example, ibuprofen) may be prescribed. Avoiding activities that cause increased pain.  Only take over-the-counter or prescription medicines for pain and discomfort as recommended by your caregiver.  Cold packs over an inflamed joint may be used for 10 to 15 minutes every hour. Hot packs sometimes feel better, but do not use overnight. Do not use hot packs if you are diabetic without your caregiver's permission.  A cortisone shot into arthritic joints may help reduce pain and swelling.  Any acute arthritis that gets worse over the next 1 to 2 days needs to be looked at to be sure there is no joint infection. Long-term arthritis treatment involves modifying activities and lifestyle to reduce joint stress jarring. This can  include weight loss. Also, exercise is needed to nourish the joint cartilage and remove waste. This helps keep the muscles around the joint strong. HOME CARE INSTRUCTIONS   Do not take aspirin to relieve pain if gout is suspected. This elevates uric acid levels.  Only take over-the-counter or prescription medicines  for pain, discomfort or fever as directed by your caregiver.  Rest the joint as much as possible.  If your joint is swollen, keep it elevated.  Use crutches if the painful joint is in your leg.  Drinking plenty of fluids may help for certain types of arthritis.  Follow your caregiver's dietary instructions.  Try low-impact exercise such as:  Swimming.  Water aerobics.  Biking.  Walking.  Morning stiffness is often relieved by a warm shower.  Put your joints through regular range-of-motion. SEEK MEDICAL CARE IF:   You do not feel better in 24 hours or are getting worse.  You have side effects to medications, or are not getting better with treatment. SEEK IMMEDIATE MEDICAL CARE IF:   You have a fever.  You develop severe joint pain, swelling or redness.  Many joints are involved and become painful and swollen.  There is severe back pain and/or leg weakness.  You have loss of bowel or bladder control. Document Released: 08/22/2004 Document Revised: 10/07/2011 Document Reviewed: 09/07/2008 [facility] Patient Information 2015 [place], [place]. This information is not intended to replace advice given to you by your health care provider. Make sure you discuss any questions you have with your health care provider.  Knee Pain The knee is the complex joint between your thigh and your lower leg. It is made up of bones, tendons, ligaments, and cartilage. The bones that make up the knee are:  The femur in the thigh.  The tibia and fibula in the lower leg.  The patella or kneecap riding in the groove on the lower femur. CAUSES  Knee pain is a common complaint with many causes. A few of these causes are:  Injury, such as:  A ruptured ligament or tendon injury.  Torn cartilage.  Medical conditions, such as:  Gout  Arthritis  Infections  Overuse, over training, or overdoing a physical activity. Knee pain can be minor or severe. Knee pain can accompany debilitating  injury. Minor knee problems often respond well to self-care measures or get well on their own. More serious injuries may need medical intervention or even surgery. SYMPTOMS The knee is complex. Symptoms of knee problems can vary widely. Some of the problems are:  Pain with movement and weight bearing.  Swelling and tenderness.  Buckling of the knee.  Inability to straighten or extend your knee.  Your knee locks and you cannot straighten it.  Warmth and redness with pain and fever.  Deformity or dislocation of the kneecap. DIAGNOSIS  Determining what is wrong may be very straight forward such as when there is an injury. It can also be challenging because of the complexity of the knee. Tests to make a diagnosis may include:  Your caregiver taking a history and doing a physical exam.  Routine X-rays can be used to rule out other problems. X-rays will not reveal a cartilage tear. Some injuries of the knee can be diagnosed by:  Arthroscopy a surgical technique by which a small video camera is inserted through tiny incisions on the sides of the knee. This procedure is used to examine and repair internal knee joint problems. Tiny instruments can be used during arthroscopy to repair  the torn knee cartilage (meniscus).  Arthrography is a radiology technique. A contrast liquid is directly injected into the knee joint. Internal structures of the knee joint then become visible on X-ray film.  An MRI scan is a non X-ray radiology procedure in which magnetic fields and a computer produce two- or three-dimensional images of the inside of the knee. Cartilage tears are often visible using an MRI scanner. MRI scans have largely replaced arthrography in diagnosing cartilage tears of the knee.  Blood work.  Examination of the fluid that helps to lubricate the knee joint (synovial fluid). This is done by taking a sample out using a needle and a syringe. TREATMENT The treatment of knee problems depends  on the cause. Some of these treatments are:  Depending on the injury, proper casting, splinting, surgery, or physical therapy care will be needed.  Give yourself adequate recovery time. Do not overuse your joints. If you begin to get sore during workout routines, back off. Slow down or do fewer repetitions.  For repetitive activities such as cycling or running, maintain your strength and nutrition.  Alternate muscle groups. For example, if you are a weight lifter, work the upper body on one day and the lower body the next.  Either tight or weak muscles do not give the proper support for your knee. Tight or weak muscles do not absorb the stress placed on the knee joint. Keep the muscles surrounding the knee strong.  Take care of mechanical problems.  If you have flat feet, orthotics or special shoes may help. See your caregiver if you need help.  Arch supports, sometimes with wedges on the inner or outer aspect of the heel, can help. These can shift pressure away from the side of the knee most bothered by osteoarthritis.  A brace called an "unloader" brace also may be used to help ease the pressure on the most arthritic side of the knee.  If your caregiver has prescribed crutches, braces, wraps or ice, use as directed. The acronym for this is PRICE. This means protection, rest, ice, compression, and elevation.  Nonsteroidal anti-inflammatory drugs (NSAIDs), can help relieve pain. But if taken immediately after an injury, they may actually increase swelling. Take NSAIDs with food in your stomach. Stop them if you develop stomach problems. Do not take these if you have a history of ulcers, stomach pain, or bleeding from the bowel. Do not take without your caregiver's approval if you have problems with fluid retention, heart failure, or kidney problems.  For ongoing knee problems, physical therapy may be helpful.  Glucosamine and chondroitin are over-the-counter dietary supplements. Both may  help relieve the pain of osteoarthritis in the knee. These medicines are different from the usual anti-inflammatory drugs. Glucosamine may decrease the rate of cartilage destruction.  Injections of a corticosteroid drug into your knee joint may help reduce the symptoms of an arthritis flare-up. They may provide pain relief that lasts a few months. You may have to wait a few months between injections. The injections do have a small increased risk of infection, water retention, and elevated blood sugar levels.  Hyaluronic acid injected into damaged joints may ease pain and provide lubrication. These injections may work by reducing inflammation. A series of shots may give relief for as long as 6 months.  Topical painkillers. Applying certain ointments to your skin may help relieve the pain and stiffness of osteoarthritis. Ask your pharmacist for suggestions. Many over the-counter products are approved for temporary relief of arthritis  pain.  In some countries, doctors often prescribe topical NSAIDs for relief of chronic conditions such as arthritis and tendinitis. A review of treatment with NSAID creams found that they worked as well as oral medications but without the serious side effects. PREVENTION  Maintain a healthy weight. Extra pounds put more strain on your joints.  Get strong, stay limber. Weak muscles are a common cause of knee injuries. Stretching is important. Include flexibility exercises in your workouts.  Be smart about exercise. If you have osteoarthritis, chronic knee pain or recurring injuries, you may need to change the way you exercise. This does not mean you have to stop being active. If your knees ache after jogging or playing basketball, consider switching to swimming, water aerobics, or other low-impact activities, at least for a few days a week. Sometimes limiting high-impact activities will provide relief.  Make sure your shoes fit well. Choose footwear that is right for your  sport.  Protect your knees. Use the proper gear for knee-sensitive activities. Use kneepads when playing volleyball or laying carpet. Buckle your seat belt every time you drive. Most shattered kneecaps occur in car accidents.  Rest when you are tired. SEEK MEDICAL CARE IF:  You have knee pain that is continual and does not seem to be getting better.  SEEK IMMEDIATE MEDICAL CARE IF:  Your knee joint feels hot to the touch and you have a high fever. MAKE SURE YOU:   Understand these instructions.  Will watch your condition.  Will get help right away if you are not doing well or get worse. Document Released: 05/12/2007 Document Revised: 10/07/2011 Document Reviewed: 05/12/2007 [facility] Patient Information 2015 [place], [place]. This information is not intended to replace advice given to you by your health care provider. Make sure you discuss any questions you have with your health care provider.

## 2015-03-28 NOTE — ED Provider Notes (Signed)
CSN: [id number]     Arrival date & time 03/28/15  1916 History   First MD Initiated Contact with Patient 03/28/15 1948     Chief Complaint  Patient presents with  . Leg Pain     (Consider location/radiation/quality/duration/timing/severity/associated sxs/prior Treatment) HPI   Pt is a 64 y.o female with hx of osteoarthritis and HTN, presents with left knee pain with history of arthritis in both knees, with acutely worsening pain in her left knee, after moving a large table and bumping it, this occurred almost week ago. She is complaining of aching and swelling over her knee and worse on the lateral side of her left knee with swelling down into her led.  She denies redness and heat to the area.  She is having slightly worse than normal difficulty ambulating, however she has been able to weight bare.  She describes muscle "tightness and pulling" in her hamstrings.    She denies any chest pain, calf pain with walking, shortness of breath, palpitations, headache, syncope. She explains that she just recently got insurance coverage, is meeting with her new PCP this coming week, she has preop labs scheduled for a total knee replacement of her right knee, she is seen by Dr. Mack Guise in [place].  She has not had her anti-hypertensives for over a year and was previously on HCTZ 25 mg.  She is asking for pain medicine specifically so that when she gets her pre-op testing done, that she will have medical documentation of prescribed narcotics.  She claims she is prescribed flexeril for her knee pain.  Also admits that she frequently smokes pot in order to deal with the chronic knee pain.  She states she is angry and agitated because of a family issue, her mother is also in the ED.  Past Medical History  Diagnosis Date  . Arthritis   . Hypertension    Past Surgical History  Procedure Laterality Date  . Abdominal hysterectomy    . Tubal ligation     Family History  Problem Relation Age of Onset  .  Cancer Mother   . Hypertension Mother   . Diabetes Mother    Social History  Substance Use Topics  . Smoking status: Current Every Day Smoker -- 0.10 packs/day    Types: Cigarettes  . Smokeless tobacco: Current User    Types: Chew  . Alcohol Use: No   OB History    No data available     Review of Systems  Constitutional: Negative.   Respiratory: Negative for cough, chest tightness, shortness of breath and wheezing.   Cardiovascular: Negative for chest pain and palpitations.  Gastrointestinal: Negative for abdominal pain.  Skin: Negative.   Neurological: Negative.  Negative for dizziness, tremors, syncope, facial asymmetry, speech difficulty, weakness, light-headedness, numbness and headaches.  Psychiatric/Behavioral: Positive for agitation.    Allergies  Nsaids  Home Medications   Prior to Admission medications   Medication Sig Start Date End Date Taking? Authorizing Provider  Multiple Vitamin (MULTIVITAMIN WITH MINERALS) TABS Take 1 tablet by mouth daily.   Yes Historical Provider, MD  acetaminophen (TYLENOL) 500 MG tablet Take 1 tablet (500 mg total) by mouth every 6 (six) hours as needed for pain. Patient not taking: Reported on 03/28/2015 12/03/12   Noland Fordyce, PA-C  cyclobenzaprine (FLEXERIL) 10 MG tablet Take 1 tablet (10 mg total) by mouth 2 (two) times daily as needed for muscle spasms. 03/28/15   Delsa Grana, PA-C  HYDROcodone-acetaminophen (NORCO/VICODIN) 5-325 MG per tablet Take  1-2 tablets by mouth every 4 (four) hours as needed for severe pain. 03/28/15   Delsa Grana, PA-C  oxyCODONE-acetaminophen (ROXICET) 5-325 MG per tablet Take 1 tablet by mouth every 6 (six) hours as needed. Patient not taking: Reported on 02/22/2015 01/11/15   Cari B Triplett, FNP   BP 187/110 mmHg  Pulse 106  Temp(Src) 98.4 F (36.9 C) (Oral)  Resp 18  SpO2 97% Physical Exam  Constitutional: She is oriented to person, place, and time. She appears well-developed and well-nourished. No  distress.  HENT:  Head: Normocephalic and atraumatic.  Right Ear: External ear normal.  Left Ear: External ear normal.  Nose: Nose normal.  Eyes: Conjunctivae and EOM are normal. Pupils are equal, round, and reactive to light. Right eye exhibits no discharge. Left eye exhibits no discharge. No scleral icterus.  Neck: Normal range of motion. Neck supple. No JVD present. No tracheal deviation present. No thyromegaly present.  Cardiovascular: Normal rate, regular rhythm, normal heart sounds and intact distal pulses.  Exam reveals no gallop and no friction rub.   No murmur heard. No pitting edema, normal pulses, symmetrical radial 2+, DP 2+  Pulmonary/Chest: Effort normal and breath sounds normal. No stridor. No respiratory distress. She has no wheezes. She has no rales. She exhibits no tenderness.  Abdominal: Soft. Bowel sounds are normal. She exhibits no distension and no mass. There is no tenderness. There is no rebound and no guarding.  Musculoskeletal: Normal range of motion. She exhibits tenderness. She exhibits no edema.  Left knee generalized ttp to anterior knee, non-tender to palpation of popliteal fossa, non-tender with calf squeeze No palpable effusion or crepitus, normal ROM  Lymphadenopathy:    She has no cervical adenopathy.  Neurological: She is alert and oriented to person, place, and time. She has normal reflexes. No cranial nerve deficit. She exhibits normal muscle tone. Coordination normal.  Normal sensation to light touch in LE  Skin: Skin is warm and dry. No rash noted. She is not diaphoretic. No erythema. No pallor.  Knee appears normal, no redness or swelling Bilateral LE equal leg circumference measured at the calf  Psychiatric: She has a normal mood and affect. Her behavior is normal. Judgment and thought content normal.  Nursing note and vitals reviewed.   ED Course  Procedures (including critical care time) Labs Review Labs Reviewed - No data to  display  Imaging Review No results found. I have personally reviewed and evaluated these images and lab results as part of my medical decision-making.   EKG Interpretation None      MDM   Final diagnoses:  Left knee pain    Left knee pain, acute on chronic - no obvious injury, no concern for joint complication, no concern for DVT. Will give NSAID, brief pain meds, sleeve, RICE tx, and will follow up with her orthopedic surgeon.  Pt is HTN in ed 187/110, pt is worked up, stated she is upset that the nursing home was not taking care of her mother, who is also here in the ED.  She is asymptomatic, including no CP, SOB, headache, vision changes.  She was given an Rx of HCTZ that she previously took.  She declined a knee sleeve, stating that she already has some at home and does not want to pay for another one.   She was given pain med here in ED.  She was seen by Dr. Regenia Skeeter.   Pt will be d/c home with pain meds, NSAIDS, HCTZ, RICE tx,  she agrees to follow up with PCP for her BP and is seeing ortho in the next week as well.  She will have pre-op labs drawn in a few days.  Feel that she can safely d/c home.    Filed Vitals:   03/28/15 1922 03/28/15 2057  BP: 192/111 187/110  Pulse: 107 106  Temp: 98.4 F (36.9 C)   TempSrc: Oral   Resp: 16 18  SpO2: 100% 97%     Delsa Grana, PA-C 04/02/15 [place], MD 04/02/15 [phone]

## 2015-03-30 ENCOUNTER — Encounter
Admission: RE | Admit: 2015-03-30 | Discharge: 2015-03-30 | Disposition: A | Discharge status: Home / Self Care | Payer: Medicare Other | Source: Ambulatory Visit | Attending: Orthopedic Surgery | Admitting: Orthopedic Surgery

## 2015-03-30 DIAGNOSIS — M1711 Unilateral primary osteoarthritis, right knee: Secondary | ICD-10-CM | POA: Insufficient documentation

## 2015-03-30 DIAGNOSIS — Z01812 Encounter for preprocedural laboratory examination: Secondary | ICD-10-CM | POA: Insufficient documentation

## 2015-03-30 DIAGNOSIS — I10 Essential (primary) hypertension: Secondary | ICD-10-CM | POA: Insufficient documentation

## 2015-03-30 DIAGNOSIS — M1990 Unspecified osteoarthritis, unspecified site: Secondary | ICD-10-CM | POA: Insufficient documentation

## 2015-03-30 HISTORY — DX: Other specified postprocedural states: R11.2

## 2015-03-30 HISTORY — DX: Other specified postprocedural states: Z98.890

## 2015-03-30 LAB — BASIC METABOLIC PANEL
ANION GAP: 8 (ref 5–15)
BUN: 24 mg/dL — AB (ref 6–20)
CHLORIDE: 101 mmol/L (ref 101–111)
CO2: 29 mmol/L (ref 22–32)
Calcium: 9.5 mg/dL (ref 8.9–10.3)
Creatinine, Ser: 1.18 mg/dL — ABNORMAL HIGH (ref 0.44–1.00)
GFR calc Af Amer: 55 mL/min — ABNORMAL LOW (ref >60)
GFR, EST NON AFRICAN AMERICAN: 47 mL/min — AB (ref >60)
GLUCOSE: 93 mg/dL (ref 65–99)
POTASSIUM: 3.9 mmol/L (ref 3.5–5.1)
SODIUM: 138 mmol/L (ref 135–145)

## 2015-03-30 LAB — PREPARE RBC (CROSSMATCH)

## 2015-03-30 LAB — URINALYSIS COMPLETE WITH MICROSCOPIC (ARMC ONLY)
BACTERIA UA: NONE SEEN
Bilirubin Urine: NEGATIVE
GLUCOSE, UA: NEGATIVE mg/dL
HGB URINE DIPSTICK: NEGATIVE
Ketones, ur: NEGATIVE mg/dL
LEUKOCYTES UA: NEGATIVE
Nitrite: NEGATIVE
PH: 5 (ref 5.0–8.0)
PROTEIN: NEGATIVE mg/dL
Specific Gravity, Urine: 1.024 (ref 1.005–1.030)

## 2015-03-30 LAB — CBC
HCT: 39.6 % (ref 35.0–47.0)
HEMOGLOBIN: 12.7 g/dL (ref 12.0–16.0)
MCH: 26.1 pg (ref 26.0–34.0)
MCHC: 32.2 g/dL (ref 32.0–36.0)
MCV: 81.2 fL (ref 80.0–100.0)
PLATELETS: 334 10*3/uL (ref 150–440)
RBC: 4.88 MIL/uL (ref 3.80–5.20)
RDW: 16.6 % — ABNORMAL HIGH (ref 11.5–14.5)
WBC: 10 10*3/uL (ref 3.6–11.0)

## 2015-03-30 LAB — PROTIME-INR
INR: 0.96
PROTHROMBIN TIME: 13 s (ref 11.4–15.0)

## 2015-03-30 LAB — APTT: aPTT: 26 seconds (ref 24–36)

## 2015-03-30 LAB — SURGICAL PCR SCREEN
MRSA, PCR: NEGATIVE
Staphylococcus aureus: POSITIVE — AB

## 2015-03-30 NOTE — OR Nursing (Signed)
Repeat type and screen and order x match the day of surgery, as per blood bank

## 2015-03-30 NOTE — OR Nursing (Signed)
Blood bank only needs a new x match order on day of surgery

## 2015-03-30 NOTE — Patient Instructions (Signed)
  Your procedure is scheduled on: Thursday Sept 15, 2016. Report to Same Day Surgery. To find out your arrival time please call [phone] between 1PM - 3PM on Wednesday Sept 14, 2016.  Remember: Instructions that are not followed completely may result in serious medical risk, up to and including death, or upon the discretion of your surgeon and anesthesiologist your surgery may need to be rescheduled.    __x__ 1. Do not eat food or drink liquids after midnight. No gum chewing or hard candies.     __x_ 2. No Alcohol for 24 hours before or after surgery.   ____ 3. Bring all medications with you on the day of surgery if instructed.    __x__ 4. Notify your doctor if there is any change in your medical condition     (cold, fever, infections).     Do not wear jewelry, make-up, hairpins, clips or nail polish.  Do not wear lotions, powders, or perfumes. You may wear deodorant.  Do not shave 48 hours prior to surgery. Men may shave face and neck.  Do not bring valuables to the hospital.    [facility] is not responsible for any belongings or valuables.               Contacts, dentures or bridgework may not be worn into surgery.  Leave your suitcase in the car. After surgery it may be brought to your room.  For patients admitted to the hospital, discharge time is determined by your treatment team.   Patients discharged the day of surgery will not be allowed to drive home.    Please read over the following fact sheets that you were given:   [facility] Preparing for Surgery  ___ Take these medicines the morning of surgery with A SIP OF WATER: NONE     ____ Fleet Enema (as directed)   _x___ Use CHG Soap as directed  ____ Use inhalers on the day of surgery  ____ Stop metformin 2 days prior to surgery    ____ Take 1/2 of usual insulin dose the night before surgery and none on the morning of surgery.   ____ Stop Coumadin/Plavix/aspirin on Does not apply.  __x__ Stop  Anti-inflammatories Ibuprofen on Sept 8, 2016.   ____ Stop supplements until after surgery.    ____ Bring C-Pap to the hospital.

## 2015-03-30 NOTE — Pre-Procedure Instructions (Signed)
Message left for Tabitha at Dr. Harden Mo office regarding positive Staph Aureus, negative MRSA.

## 2015-04-12 LAB — PREPARE RBC (CROSSMATCH)

## 2015-04-13 ENCOUNTER — Inpatient Hospital Stay: Admission: RE | Admit: 2015-04-13 | Payer: Medicare Other | Source: Ambulatory Visit | Admitting: Orthopedic Surgery

## 2015-04-13 ENCOUNTER — Encounter: Admission: RE | Payer: Self-pay | Source: Ambulatory Visit

## 2015-04-13 SURGERY — ARTHROPLASTY, KNEE, TOTAL
Anesthesia: Choice | Laterality: Right

## 2015-04-14 LAB — TYPE AND SCREEN
ABO/RH(D): B POS
ANTIBODY SCREEN: NEGATIVE
UNIT DIVISION: 0
Unit division: 0

## 2015-04-17 DIAGNOSIS — R5383 Other fatigue: Secondary | ICD-10-CM | POA: Diagnosis not present

## 2015-04-17 DIAGNOSIS — R112 Nausea with vomiting, unspecified: Secondary | ICD-10-CM | POA: Diagnosis not present

## 2015-04-17 DIAGNOSIS — E079 Disorder of thyroid, unspecified: Secondary | ICD-10-CM | POA: Diagnosis not present

## 2015-04-17 DIAGNOSIS — F17210 Nicotine dependence, cigarettes, uncomplicated: Secondary | ICD-10-CM | POA: Diagnosis not present

## 2015-04-17 DIAGNOSIS — M25562 Pain in left knee: Secondary | ICD-10-CM | POA: Diagnosis not present

## 2015-04-17 DIAGNOSIS — E538 Deficiency of other specified B group vitamins: Secondary | ICD-10-CM | POA: Diagnosis not present

## 2015-04-17 DIAGNOSIS — I10 Essential (primary) hypertension: Secondary | ICD-10-CM | POA: Diagnosis not present

## 2015-04-17 DIAGNOSIS — Z0001 Encounter for general adult medical examination with abnormal findings: Secondary | ICD-10-CM | POA: Diagnosis not present

## 2015-04-17 DIAGNOSIS — E0781 Sick-euthyroid syndrome: Secondary | ICD-10-CM | POA: Diagnosis not present

## 2015-05-03 DIAGNOSIS — Z1231 Encounter for screening mammogram for malignant neoplasm of breast: Secondary | ICD-10-CM | POA: Diagnosis not present

## 2015-05-03 DIAGNOSIS — R1013 Epigastric pain: Secondary | ICD-10-CM | POA: Diagnosis not present

## 2015-05-03 DIAGNOSIS — R1011 Right upper quadrant pain: Secondary | ICD-10-CM | POA: Diagnosis not present

## 2015-05-23 DIAGNOSIS — R112 Nausea with vomiting, unspecified: Secondary | ICD-10-CM | POA: Diagnosis not present

## 2015-05-23 DIAGNOSIS — Z23 Encounter for immunization: Secondary | ICD-10-CM | POA: Diagnosis not present

## 2015-05-23 DIAGNOSIS — M170 Bilateral primary osteoarthritis of knee: Secondary | ICD-10-CM | POA: Diagnosis not present

## 2015-05-23 DIAGNOSIS — K760 Fatty (change of) liver, not elsewhere classified: Secondary | ICD-10-CM | POA: Diagnosis not present

## 2015-05-23 DIAGNOSIS — D649 Anemia, unspecified: Secondary | ICD-10-CM | POA: Diagnosis not present

## 2015-05-23 DIAGNOSIS — I10 Essential (primary) hypertension: Secondary | ICD-10-CM | POA: Diagnosis not present

## 2015-05-23 DIAGNOSIS — M5441 Lumbago with sciatica, right side: Secondary | ICD-10-CM | POA: Diagnosis not present

## 2015-07-01 ENCOUNTER — Encounter: Payer: Self-pay | Admitting: Emergency Medicine

## 2015-07-01 ENCOUNTER — Emergency Department
Admission: EM | Admit: 2015-07-01 | Discharge: 2015-07-01 | Disposition: A | Discharge status: Home / Self Care | Payer: Medicare Other | Attending: Emergency Medicine | Admitting: Emergency Medicine

## 2015-07-01 DIAGNOSIS — F17210 Nicotine dependence, cigarettes, uncomplicated: Secondary | ICD-10-CM | POA: Insufficient documentation

## 2015-07-01 DIAGNOSIS — Z791 Long term (current) use of non-steroidal anti-inflammatories (NSAID): Secondary | ICD-10-CM | POA: Diagnosis not present

## 2015-07-01 DIAGNOSIS — S83207A Unspecified tear of unspecified meniscus, current injury, left knee, initial encounter: Secondary | ICD-10-CM | POA: Diagnosis not present

## 2015-07-01 DIAGNOSIS — S83204A Other tear of unspecified meniscus, current injury, left knee, initial encounter: Secondary | ICD-10-CM | POA: Insufficient documentation

## 2015-07-01 DIAGNOSIS — Y9289 Other specified places as the place of occurrence of the external cause: Secondary | ICD-10-CM | POA: Diagnosis not present

## 2015-07-01 DIAGNOSIS — M1712 Unilateral primary osteoarthritis, left knee: Secondary | ICD-10-CM | POA: Diagnosis not present

## 2015-07-01 DIAGNOSIS — M1711 Unilateral primary osteoarthritis, right knee: Secondary | ICD-10-CM | POA: Insufficient documentation

## 2015-07-01 DIAGNOSIS — X501XXA Overexertion from prolonged static or awkward postures, initial encounter: Secondary | ICD-10-CM | POA: Diagnosis not present

## 2015-07-01 DIAGNOSIS — G8929 Other chronic pain: Secondary | ICD-10-CM | POA: Diagnosis not present

## 2015-07-01 DIAGNOSIS — Z79899 Other long term (current) drug therapy: Secondary | ICD-10-CM | POA: Insufficient documentation

## 2015-07-01 DIAGNOSIS — Y9389 Activity, other specified: Secondary | ICD-10-CM | POA: Insufficient documentation

## 2015-07-01 DIAGNOSIS — M170 Bilateral primary osteoarthritis of knee: Secondary | ICD-10-CM

## 2015-07-01 DIAGNOSIS — Y998 Other external cause status: Secondary | ICD-10-CM | POA: Diagnosis not present

## 2015-07-01 DIAGNOSIS — I10 Essential (primary) hypertension: Secondary | ICD-10-CM | POA: Insufficient documentation

## 2015-07-01 DIAGNOSIS — S8992XA Unspecified injury of left lower leg, initial encounter: Secondary | ICD-10-CM | POA: Diagnosis present

## 2015-07-01 MED ORDER — OXYCODONE HCL 5 MG PO TABS
5.0000 mg | ORAL_TABLET | Freq: Once | ORAL | Status: AC
  Administered 2015-07-01: 5 mg via ORAL
  Filled 2015-07-01: qty 1

## 2015-07-01 MED ORDER — MELOXICAM 15 MG PO TABS: 15.0000 mg | ORAL_TABLET | Freq: Every day | ORAL | Status: DC

## 2015-07-01 MED ORDER — OXYCODONE HCL 5 MG PO TABS: 5.0000 mg | ORAL_TABLET | Freq: Three times a day (TID) | ORAL | Status: DC | PRN

## 2015-07-01 NOTE — ED Provider Notes (Signed)
[facility] Emergency Department Provider Note  ____________________________________________  Time seen: Approximately 1:00 PM  I have reviewed the triage vital signs and the nursing notes.   HISTORY  Chief Complaint Knee Pain    HPI Katherine Hartman is a 64 y.o. female who presents emergency department with increase in her chronic left knee pain. She states that she was getting up this morning, twisted, felt a sharp sensation in the lateral aspect of her knee and has had increased knee pain ever since. Patient states the symptoms are sharp, worse with twisting or weightbearing. She is tried over-the-counter medications with no relief.   Past Medical History  Diagnosis Date  . Arthritis   . Hypertension   . PONV (postoperative nausea and vomiting)     There are no active problems to display for this patient.   Past Surgical History  Procedure Laterality Date  . Abdominal hysterectomy    . Tubal ligation    . Bladder suspension N/A     Current Outpatient Rx  Name  Route  Sig  Dispense  Refill  . acetaminophen (TYLENOL) 500 MG tablet   Oral   Take 1 tablet (500 mg total) by mouth every 6 (six) hours as needed for pain. Patient not taking: Reported on 03/28/2015   30 tablet   0   . cyclobenzaprine (FLEXERIL) 10 MG tablet   Oral   Take 1 tablet (10 mg total) by mouth 2 (two) times daily as needed for muscle spasms.   20 tablet   0   . hydrochlorothiazide (HYDRODIURIL) 12.5 MG tablet   Oral   Take 2 tablets (25 mg total) by mouth daily. Patient taking differently: Take 25 mg by mouth every morning.    30 tablet   0   . ibuprofen (ADVIL,MOTRIN) 800 MG tablet   Oral   Take 1 tablet (800 mg total) by mouth 3 (three) times daily.   21 tablet   0   . meloxicam (MOBIC) 15 MG tablet   Oral   Take 1 tablet (15 mg total) by mouth daily.   30 tablet   0   . Multiple Vitamin (MULTIVITAMIN WITH MINERALS) TABS   Oral   Take 1 tablet by mouth  daily.         Marland Kitchen oxyCODONE (ROXICODONE) 5 MG immediate release tablet   Oral   Take 1 tablet (5 mg total) by mouth every 8 (eight) hours as needed.   20 tablet   0     Allergies Nsaids  Family History  Problem Relation Age of Onset  . Cancer Mother   . Hypertension Mother   . Diabetes Mother     Social History Social History  Substance Use Topics  . Smoking status: Current Every Day Smoker -- 0.10 packs/day    Types: Cigarettes  . Smokeless tobacco: Current User  . Alcohol Use: No    Review of Systems Constitutional: No fever/chills Eyes: No visual changes. ENT: No sore throat. Cardiovascular: Denies chest pain. Respiratory: Denies shortness of breath. Gastrointestinal: No abdominal pain.  No nausea, no vomiting.  No diarrhea.  No constipation. Genitourinary: Negative for dysuria. Musculoskeletal: Negative for back pain. Versus left lateral knee pain. Skin: Negative for rash. Neurological: Negative for headaches, focal weakness or numbness.  10-point ROS otherwise negative.  ____________________________________________   PHYSICAL EXAM:  VITAL SIGNS: ED Triage Vitals  Enc Vitals Group     BP 07/01/15 1255 192/116 mmHg     Pulse  Rate 07/01/15 1255 100     Resp 07/01/15 1255 18     Temp 07/01/15 1255 98.4 F (36.9 C)     Temp src --      SpO2 07/01/15 1255 99 %     Weight 07/01/15 1255 210 lb (95.255 kg)     Height 07/01/15 1255 5\' 9"  (1.753 m)     Head Cir --      Peak Flow --      Pain Score 07/01/15 1256 10     Pain Loc --      Pain Edu? --      Excl. in [place]? --     Constitutional: Alert and oriented. Well appearing and in no acute distress. Eyes: Conjunctivae are normal. PERRL. EOMI. Head: Atraumatic. Nose: No congestion/rhinnorhea. Mouth/Throat: Mucous membranes are moist.  Oropharynx non-erythematous. Neck: No stridor.   Cardiovascular: Normal rate, regular rhythm. Grossly normal heart sounds.  Good peripheral circulation. Respiratory:  Normal respiratory effort.  No retractions. Lungs CTAB. Gastrointestinal: Soft and nontender. No distention. No abdominal bruits. No CVA tenderness. Musculoskeletal: Minimal edema noted to the lateral anterior aspect left knee when compared with right. Tenderness to palpation over the lateral joint line. No other tenderness to palpation. Varus and valgus, and Lachman's are negative. Patient does have a positive McMurray test. Also since sensation intact distally. Neurologic:  Normal speech and language. No gross focal neurologic deficits are appreciated. No gait instability. Skin:  Skin is warm, dry and intact. No rash noted. Psychiatric: Mood and affect are normal. Speech and behavior are normal.  ____________________________________________   LABS (all labs ordered are listed, but only abnormal results are displayed)  Labs Reviewed - No data to display ____________________________________________  EKG   ____________________________________________  RADIOLOGY   ____________________________________________   PROCEDURES  Procedure(s) performed: None  Critical Care performed: No  ____________________________________________   INITIAL IMPRESSION / ASSESSMENT AND PLAN / ED COURSE  Pertinent labs & imaging results that were available during my care of the patient were reviewed by me and considered in my medical decision making (see chart for details).  Patient's history, symptoms, physical exam are taken and the consideration for diagnosis. I advised patient of findings and diagnosis and she verbalizes understanding of same. I explained the treatment plan to the patient and the patient verbalizes understanding and compliance with same. Patient is to follow-up with primary care provider or specialist provided on paperwork for further evaluation and treatment should symptoms persist past treatment course. All of the patient's questions are  answered. ____________________________________________   FINAL CLINICAL IMPRESSION(S) / ED DIAGNOSES  Final diagnoses:  Primary osteoarthritis of both knees  Acute meniscal tear of knee, left, initial encounter      Darletta Moll, PA-C 07/01/15 1314  Lavonia Drafts, MD 07/01/15 1418

## 2015-07-01 NOTE — ED Notes (Signed)
States he knee gave out and she fell, pain to left knee

## 2015-07-01 NOTE — Discharge Instructions (Signed)
Meniscus Tear A meniscus tear is a knee injury in which a piece of the meniscus is torn. The meniscus is a thick, rubbery, wedge-shaped cartilage in the knee. Two menisci are located in each knee. They sit between the upper bone (femur) and lower bone (tibia) that make up the knee joint. Each meniscus acts as a shock absorber for the knee. A torn meniscus is one of the most common types of knee injuries. This injury can range from mild to severe. Surgery may be needed for a severe tear. CAUSES This injury may be caused by any squatting, twisting, or pivoting movement. Sports-related injuries are the most common cause. These often occur from:  Running and stopping suddenly.  Changing direction.  Being tackled or knocked off your feet. As people get older, their meniscus gets thinner and weaker. In these people, tears can happen more easily, such as from climbing stairs.  RISK FACTORS This injury is more likely to happen to:  People who play contact sports.  Males.  People who are 55-42 years of age. SYMPTOMS  Symptoms of this injury include:  Knee pain, especially at the side of the knee joint. You may feel pain when the injury occurs, or you may only hear a pop and feel pain later.  A feeling that your knee is clicking, catching, locking, or giving way.  Not being able to fully bend or extend your knee.  Bruising or swelling in your knee. DIAGNOSIS  This injury may be diagnosed based on your symptoms and a physical exam. The physical exam may include:  Moving your knee in different ways.  Feeling for tenderness.  Listening for a clicking sound.  Checking if your knee locks or catches. You may also have tests, such as:  X-rays.  MRI.  A procedure to look inside your knee with a narrow surgical telescope (arthroscopy). You may be referred to a knee specialist (orthopedic surgeon). TREATMENT  Treatment for this injury depends on the severity of the tear. Treatment for a  mild tear may include:  Rest.  Medicine to reduce pain and swelling. This is usually a nonsteroidal anti-inflammatory drug (NSAID).  A knee brace or an elastic sleeve or wrap.  Using crutches or a walker to keep weight off your knee and to help you walk.  Exercises to strengthen your knee (physical therapy). You may need surgery if you have a severe tear or if other treatments are not working.  HOME CARE INSTRUCTIONS Managing Pain and Swelling  Take over-the-counter and prescription medicines only as told by your health care provider.  If directed, apply ice to the injured area:  Put ice in a plastic bag.  Place a towel between your skin and the bag.  Leave the ice on for 20 minutes, 2-3 times per day.  Raise (elevate) the injured area above the level of your heart while you are sitting or lying down. Activity  Do not use the injured limb to support your body weight until your health care provider says that you can. Use crutches or a walker as told by your health care provider.  Return to your normal activities as told by your health care provider. Ask your health care provider what activities are safe for you.  Perform range-of-motion exercises only as told by your health care provider.  Begin doing exercises to strengthen your knee and leg muscles only as told by your health care provider. After you recover, your health care provider may recommend these exercises to  help prevent another injury. General Instructions  Use a knee brace or elastic wrap as told by your health care provider.  Keep all follow-up visits as told by your health care provider. This is important. SEEK MEDICAL CARE IF:  You have a fever.  Your knee becomes red, tender, or swollen.  Your pain medicine is not helping.  Your symptoms get worse or do not improve after 2 weeks of home care.   This information is not intended to replace advice given to you by your health care provider. Make sure you  discuss any questions you have with your health care provider.   Document Released: 10/05/2002 Document Revised: 04/05/2015 Document Reviewed: 11/07/2014 Elsevier Interactive Patient Education 2016 Elsevier Inc.  Osteoarthritis Osteoarthritis is a disease that causes soreness and inflammation of a joint. It occurs when the cartilage at the affected joint wears down. Cartilage acts as a cushion, covering the ends of bones where they meet to form a joint. Osteoarthritis is the most common form of arthritis. It often occurs in older people. The joints affected most often by this condition include those in the:  Ends of the fingers.  Thumbs.  Neck.  Lower back.  Knees.  Hips. CAUSES  Over time, the cartilage that covers the ends of bones begins to wear away. This causes bone to rub on bone, producing pain and stiffness in the affected joints.  RISK FACTORS Certain factors can increase your chances of having osteoarthritis, including:  Older age.  Excessive body weight.  Overuse of joints.  Previous joint injury. SIGNS AND SYMPTOMS   Pain, swelling, and stiffness in the joint.  Over time, the joint may lose its normal shape.  Small deposits of bone (osteophytes) may grow on the edges of the joint.  Bits of bone or cartilage can break off and float inside the joint space. This may cause more pain and damage. DIAGNOSIS  Your health care provider will do a physical exam and ask about your symptoms. Various tests may be ordered, such as:  X-rays of the affected joint.  Blood tests to rule out other types of arthritis. Additional tests may be used to diagnose your condition. TREATMENT  Goals of treatment are to control pain and improve joint function. Treatment plans may include:  A prescribed exercise program that allows for rest and joint relief.  A weight control plan.  Pain relief techniques, such as:  Properly applied heat and cold.  Electric pulses delivered to  nerve endings under the skin (transcutaneous electrical nerve stimulation [TENS]).  Massage.  Certain nutritional supplements.  Medicines to control pain, such as:  Acetaminophen.  Nonsteroidal anti-inflammatory drugs (NSAIDs), such as naproxen.  Narcotic or central-acting agents, such as tramadol.  Corticosteroids. These can be given orally or as an injection.  Surgery to reposition the bones and relieve pain (osteotomy) or to remove loose pieces of bone and cartilage. Joint replacement may be needed in advanced states of osteoarthritis. HOME CARE INSTRUCTIONS   Take medicines only as directed by your health care provider.  Maintain a healthy weight. Follow your health care provider's instructions for weight control. This may include dietary instructions.  Exercise as directed. Your health care provider can recommend specific types of exercise. These may include:  Strengthening exercises. These are done to strengthen the muscles that support joints affected by arthritis. They can be performed with weights or with exercise bands to add resistance.  Aerobic activities. These are exercises, such as brisk walking or low-impact aerobics,  that get your heart pumping.  Range-of-motion activities. These keep your joints limber.  Balance and agility exercises. These help you maintain daily living skills.  Rest your affected joints as directed by your health care provider.  Keep all follow-up visits as directed by your health care provider. SEEK MEDICAL CARE IF:   Your skin turns red.  You develop a rash in addition to your joint pain.  You have worsening joint pain.  You have a fever along with joint or muscle aches. SEEK IMMEDIATE MEDICAL CARE IF:  You have a significant loss of weight or appetite.  You have night sweats. [place] of Arthritis and Musculoskeletal and Skin Diseases: www.niams.SouthExposed.es  [employer] on Aging:  http://kim-miller.com/  American College of Rheumatology: www.rheumatology.org   This information is not intended to replace advice given to you by your health care provider. Make sure you discuss any questions you have with your health care provider.   Document Released: 07/15/2005 Document Revised: 08/05/2014 Document Reviewed: 03/22/2013 Elsevier Interactive Patient Education [employer].

## 2015-07-05 ENCOUNTER — Other Ambulatory Visit: Payer: Self-pay | Admitting: Orthopedic Surgery

## 2015-07-05 DIAGNOSIS — M1711 Unilateral primary osteoarthritis, right knee: Secondary | ICD-10-CM | POA: Diagnosis not present

## 2015-07-05 DIAGNOSIS — M25561 Pain in right knee: Secondary | ICD-10-CM | POA: Diagnosis not present

## 2015-07-05 DIAGNOSIS — G8929 Other chronic pain: Secondary | ICD-10-CM | POA: Diagnosis not present

## 2015-07-11 ENCOUNTER — Ambulatory Visit
Admission: RE | Admit: 2015-07-11 | Discharge: 2015-07-11 | Disposition: A | Discharge status: Home / Self Care | Payer: Medicare Other | Source: Ambulatory Visit | Attending: Orthopedic Surgery | Admitting: Orthopedic Surgery

## 2015-07-11 DIAGNOSIS — M179 Osteoarthritis of knee, unspecified: Secondary | ICD-10-CM | POA: Diagnosis not present

## 2015-07-11 DIAGNOSIS — M1711 Unilateral primary osteoarthritis, right knee: Secondary | ICD-10-CM | POA: Diagnosis not present

## 2015-07-11 DIAGNOSIS — Z01818 Encounter for other preprocedural examination: Secondary | ICD-10-CM | POA: Diagnosis not present

## 2015-07-13 DIAGNOSIS — I10 Essential (primary) hypertension: Secondary | ICD-10-CM | POA: Diagnosis not present

## 2015-07-13 DIAGNOSIS — K760 Fatty (change of) liver, not elsewhere classified: Secondary | ICD-10-CM | POA: Diagnosis not present

## 2015-07-13 DIAGNOSIS — M170 Bilateral primary osteoarthritis of knee: Secondary | ICD-10-CM | POA: Diagnosis not present

## 2015-07-13 DIAGNOSIS — Z0001 Encounter for general adult medical examination with abnormal findings: Secondary | ICD-10-CM | POA: Diagnosis not present

## 2015-07-13 DIAGNOSIS — F17210 Nicotine dependence, cigarettes, uncomplicated: Secondary | ICD-10-CM | POA: Diagnosis not present

## 2015-07-13 DIAGNOSIS — M5441 Lumbago with sciatica, right side: Secondary | ICD-10-CM | POA: Diagnosis not present

## 2015-07-30 HISTORY — PX: JOINT REPLACEMENT: SHX530

## 2015-08-02 ENCOUNTER — Other Ambulatory Visit: Payer: Medicare Other

## 2015-08-08 ENCOUNTER — Encounter
Admission: RE | Admit: 2015-08-08 | Discharge: 2015-08-08 | Disposition: A | Discharge status: Home / Self Care | Payer: Medicare Other | Source: Ambulatory Visit | Attending: Orthopedic Surgery | Admitting: Orthopedic Surgery

## 2015-08-08 DIAGNOSIS — Z01812 Encounter for preprocedural laboratory examination: Secondary | ICD-10-CM | POA: Insufficient documentation

## 2015-08-08 DIAGNOSIS — M1711 Unilateral primary osteoarthritis, right knee: Secondary | ICD-10-CM | POA: Insufficient documentation

## 2015-08-08 HISTORY — DX: Gastro-esophageal reflux disease without esophagitis: K21.9

## 2015-08-08 LAB — URINALYSIS COMPLETE WITH MICROSCOPIC (ARMC ONLY)
BILIRUBIN URINE: NEGATIVE
Bacteria, UA: NONE SEEN
GLUCOSE, UA: NEGATIVE mg/dL
Hgb urine dipstick: NEGATIVE
Ketones, ur: NEGATIVE mg/dL
Nitrite: NEGATIVE
Protein, ur: NEGATIVE mg/dL
Specific Gravity, Urine: 1.02 (ref 1.005–1.030)
pH: 5 (ref 5.0–8.0)

## 2015-08-08 LAB — BASIC METABOLIC PANEL
Anion gap: 7 (ref 5–15)
BUN: 26 mg/dL — AB (ref 6–20)
CALCIUM: 9.5 mg/dL (ref 8.9–10.3)
CHLORIDE: 104 mmol/L (ref 101–111)
CO2: 28 mmol/L (ref 22–32)
CREATININE: 1.01 mg/dL — AB (ref 0.44–1.00)
GFR calc non Af Amer: 57 mL/min — ABNORMAL LOW (ref >60)
Glucose, Bld: 89 mg/dL (ref 65–99)
Potassium: 4.5 mmol/L (ref 3.5–5.1)
SODIUM: 139 mmol/L (ref 135–145)

## 2015-08-08 LAB — CBC
HCT: 35.3 % (ref 35.0–47.0)
HEMOGLOBIN: 11.5 g/dL — AB (ref 12.0–16.0)
MCH: 26.2 pg (ref 26.0–34.0)
MCHC: 32.5 g/dL (ref 32.0–36.0)
MCV: 80.5 fL (ref 80.0–100.0)
Platelets: 310 10*3/uL (ref 150–440)
RBC: 4.38 MIL/uL (ref 3.80–5.20)
RDW: 15.8 % — AB (ref 11.5–14.5)
WBC: 8 10*3/uL (ref 3.6–11.0)

## 2015-08-08 LAB — TYPE AND SCREEN
ABO/RH(D): B POS
ANTIBODY SCREEN: NEGATIVE

## 2015-08-08 LAB — SURGICAL PCR SCREEN
MRSA, PCR: NEGATIVE
STAPHYLOCOCCUS AUREUS: POSITIVE — AB

## 2015-08-08 LAB — SEDIMENTATION RATE: Sed Rate: 47 mm/hr — ABNORMAL HIGH (ref 0–30)

## 2015-08-08 LAB — PROTIME-INR
INR: 0.99
Prothrombin Time: 13.3 seconds (ref 11.4–15.0)

## 2015-08-08 LAB — APTT: APTT: 26 s (ref 24–36)

## 2015-08-08 NOTE — Patient Instructions (Signed)
  Your procedure is scheduled on: 08/15/15 Report to Day Surgery. To find out your arrival time please call [phone] between 1PM - 3PM on 08/14/15.  Remember: Instructions that are not followed completely may result in serious medical risk, up to and including death, or upon the discretion of your surgeon and anesthesiologist your surgery may need to be rescheduled.    __x__ 1. Do not eat food or drink liquids after midnight. No gum chewing or hard candies.     __x__ 2. No Alcohol for 24 hours before or after surgery.   ____ 3. Bring all medications with you on the day of surgery if instructed.    __x__ 4. Notify your doctor if there is any change in your medical condition     (cold, fever, infections).     Do not wear jewelry, make-up, hairpins, clips or nail polish.  Do not wear lotions, powders, or perfumes. You may wear deodorant.  Do not shave 48 hours prior to surgery. Men may shave face and neck.  Do not bring valuables to the hospital.    [facility] is not responsible for any belongings or valuables.               Contacts, dentures or bridgework may not be worn into surgery.  Leave your suitcase in the car. After surgery it may be brought to your room.  For patients admitted to the hospital, discharge time is determined by your                treatment team.   Patients discharged the day of surgery will not be allowed to drive home.   Please read over the following fact sheets that you were given:   MRSA Information and Surgical Site Infection Prevention   ____ Take these medicines the morning of surgery with A SIP OF WATER:    1. Omeprazole the night before and morning of surgery  2. Bring the b/p pill   3. May take pain medication if needed  4.  5.  6.  ____ Fleet Enema (as directed)   _x___ Use CHG Soap as directed  ____ Use inhalers on the day of surgery  ____ Stop metformin 2 days prior to surgery    ____ Take 1/2 of usual insulin dose the night  before surgery and none on the morning of surgery.   ____ Stop Coumadin/Plavix/aspirin on   __x__ Stop Anti-inflammatories on today (meloxicam)   ____ Stop supplements until after surgery.    ____ Bring C-Pap to the hospital.

## 2015-08-10 LAB — URINE CULTURE: Special Requests: NORMAL

## 2015-08-10 NOTE — Pre-Procedure Instructions (Signed)
Urine culture sent to Dr. Rudene Christians.  Questioned if wanted recollection?

## 2015-08-10 NOTE — Pre-Procedure Instructions (Signed)
Office notified of + Staff -MRSA per Fax on 1/10

## 2015-08-10 NOTE — Pre-Procedure Instructions (Signed)
Office called to confirm receipt of fax of + Staff

## 2015-08-11 NOTE — Pre-Procedure Instructions (Signed)
Fax received stating no recollection of urine culture needed

## 2015-08-11 NOTE — Pre-Procedure Instructions (Signed)
re faxed urine culture results to [facility] at Dr Rudene Christians office

## 2015-08-15 ENCOUNTER — Encounter
Admission: RE | Disposition: A | Discharge status: Home / Self Care | Payer: Self-pay | Source: Ambulatory Visit | Attending: Orthopedic Surgery

## 2015-08-15 ENCOUNTER — Inpatient Hospital Stay: Payer: Medicare Other | Admitting: Certified Registered Nurse Anesthetist

## 2015-08-15 ENCOUNTER — Ambulatory Visit
Admission: RE | Admit: 2015-08-15 | Discharge: 2015-08-15 | DRG: 951 | Disposition: A | Discharge status: Home / Self Care | Payer: Medicare Other | Source: Ambulatory Visit | Attending: Orthopedic Surgery | Admitting: Orthopedic Surgery

## 2015-08-15 DIAGNOSIS — Z538 Procedure and treatment not carried out for other reasons: Secondary | ICD-10-CM | POA: Diagnosis not present

## 2015-08-15 LAB — URINE DRUG SCREEN, QUALITATIVE (ARMC ONLY)
AMPHETAMINES, UR SCREEN: NOT DETECTED
BARBITURATES, UR SCREEN: NOT DETECTED
BENZODIAZEPINE, UR SCRN: NOT DETECTED
Cannabinoid 50 Ng, Ur ~~LOC~~: NOT DETECTED
Cocaine Metabolite,Ur ~~LOC~~: POSITIVE — AB
MDMA (Ecstasy)Ur Screen: NOT DETECTED
METHADONE SCREEN, URINE: NOT DETECTED
Opiate, Ur Screen: NOT DETECTED
Phencyclidine (PCP) Ur S: NOT DETECTED
TRICYCLIC, UR SCREEN: NOT DETECTED

## 2015-08-15 SURGERY — ARTHROPLASTY, KNEE, TOTAL
Anesthesia: Choice | Laterality: Right

## 2015-08-15 MED ORDER — ACETAMINOPHEN 10 MG/ML IV SOLN
INTRAVENOUS | Status: AC
  Filled 2015-08-15: qty 100

## 2015-08-15 MED ORDER — CEFAZOLIN SODIUM-DEXTROSE 2-3 GM-% IV SOLR
INTRAVENOUS | Status: AC
  Filled 2015-08-15: qty 50

## 2015-08-15 MED ORDER — CEFAZOLIN SODIUM-DEXTROSE 2-3 GM-% IV SOLR
2.0000 g | Freq: Once | INTRAVENOUS | Status: DC
Start: 2015-08-15 — End: 2015-08-15

## 2015-08-15 MED ORDER — LACTATED RINGERS IV SOLN
INTRAVENOUS | Status: DC
  Administered 2015-08-15: 06:00:00 via INTRAVENOUS

## 2015-08-15 MED ORDER — TRANEXAMIC ACID 1000 MG/10ML IV SOLN
1000.0000 mg | INTRAVENOUS | Status: DC
  Filled 2015-08-15: qty 10

## 2015-08-15 MED ORDER — SCOPOLAMINE 1 MG/3DAYS TD PT72
MEDICATED_PATCH | TRANSDERMAL | Status: AC
  Administered 2015-08-15: 1.5 mg via TRANSDERMAL
  Filled 2015-08-15: qty 1

## 2015-08-15 MED ORDER — SCOPOLAMINE 1 MG/3DAYS TD PT72
1.0000 | MEDICATED_PATCH | TRANSDERMAL | Status: DC
  Administered 2015-08-15: 1.5 mg via TRANSDERMAL

## 2015-08-15 SURGICAL SUPPLY — 47 items
BANDAGE ACE 6X5 VEL STRL LF (GAUZE/BANDAGES/DRESSINGS) IMPLANT
BLADE SAW 1 (BLADE) IMPLANT
CANISTER SUCT 1200ML W/VALVE (MISCELLANEOUS) IMPLANT
CANISTER SUCT 3000ML (MISCELLANEOUS) IMPLANT
CATH FOL LEG HOLDER (MISCELLANEOUS) IMPLANT
CATH TRAY METER 16FR LF (MISCELLANEOUS) IMPLANT
CHLORAPREP W/TINT 26ML (MISCELLANEOUS) IMPLANT
COOLER POLAR GLACIER W/PUMP (MISCELLANEOUS) IMPLANT
DRAPE INCISE IOBAN 66X45 STRL (DRAPES) IMPLANT
DRAPE SHEET LG 3/4 BI-LAMINATE (DRAPES) IMPLANT
ELECT CAUTERY BLADE 6.4 (BLADE) IMPLANT
ELECT REM PT RETURN 9FT ADLT (ELECTROSURGICAL)
ELECTRODE REM PT RTRN 9FT ADLT (ELECTROSURGICAL) IMPLANT
GAUZE PETRO XEROFOAM 1X8 (MISCELLANEOUS) IMPLANT
GAUZE SPONGE 4X4 12PLY STRL (GAUZE/BANDAGES/DRESSINGS) IMPLANT
GLOVE BIOGEL PI IND STRL 9 (GLOVE) IMPLANT
GLOVE BIOGEL PI INDICATOR 9 (GLOVE)
GLOVE SURG ORTHO 9.0 STRL STRW (GLOVE) IMPLANT
GOWN SPECIALTY ULTRA XL (MISCELLANEOUS) IMPLANT
GOWN STRL REUS W/ TWL LRG LVL3 (GOWN DISPOSABLE) IMPLANT
GOWN STRL REUS W/TWL LRG LVL3 (GOWN DISPOSABLE)
HANDPIECE SUCTION TUBG SURGILV (MISCELLANEOUS) IMPLANT
HOOD PEEL AWAY FACE SHEILD DIS (HOOD) IMPLANT
IMMBOLIZER KNEE 19 BLUE UNIV (SOFTGOODS) IMPLANT
IV SET EXTENSION 6 LL TADAPT (SET/KITS/TRAYS/PACK) IMPLANT
KNIFE SCULPS 14X20 (INSTRUMENTS) IMPLANT
NDL SAFETY 18GX1.5 (NEEDLE) IMPLANT
NEEDLE SPNL 18GX3.5 QUINCKE PK (NEEDLE) IMPLANT
NEEDLE SPNL 20GX3.5 QUINCKE YW (NEEDLE) IMPLANT
NS IRRIG 1000ML POUR BTL (IV SOLUTION) IMPLANT
PACK TOTAL KNEE (MISCELLANEOUS) IMPLANT
PAD WRAPON POLAR KNEE (MISCELLANEOUS) IMPLANT
SOL .9 NS 3000ML IRR  AL (IV SOLUTION)
SOL .9 NS 3000ML IRR UROMATIC (IV SOLUTION) IMPLANT
STAPLER SKIN PROX 35W (STAPLE) IMPLANT
STRAP SAFETY BODY (MISCELLANEOUS) IMPLANT
SUCTION FRAZIER HANDLE 10FR (MISCELLANEOUS)
SUCTION TUBE FRAZIER 10FR DISP (MISCELLANEOUS) IMPLANT
SUT DVC 2 QUILL PDO  T11 36X36 (SUTURE)
SUT DVC 2 QUILL PDO T11 36X36 (SUTURE) IMPLANT
SUT DVC QUILL MONODERM 30X30 (SUTURE) IMPLANT
SUT ETHIBOND NAB CT1 #1 30IN (SUTURE) IMPLANT
SYR 20CC LL (SYRINGE) IMPLANT
SYR 50ML LL SCALE MARK (SYRINGE) IMPLANT
TOWER CARTRIDGE SMART MIX (DISPOSABLE) IMPLANT
WATER STERILE IRR 1000ML POUR (IV SOLUTION) IMPLANT
WRAPON POLAR PAD KNEE (MISCELLANEOUS)

## 2015-08-15 NOTE — OR Nursing (Signed)
Dr. Rosey Bath in to see patient and discussed positive drug screen.  Procedure canceled per anesthesia.

## 2015-08-15 NOTE — OR Nursing (Signed)
Scopolamine patch removed.

## 2015-08-15 NOTE — OR Nursing (Signed)
Dr. Rudene Christians in to see patient and discuss plan of care.

## 2015-08-15 NOTE — Progress Notes (Signed)
Surgery cancelled secondary to positive drug screen for cocaine. Will need to reschedule.

## 2015-08-24 ENCOUNTER — Inpatient Hospital Stay: Payer: Medicare Other

## 2015-08-24 ENCOUNTER — Encounter: Payer: Self-pay | Admitting: *Deleted

## 2015-08-24 ENCOUNTER — Inpatient Hospital Stay: Payer: Medicare Other | Admitting: Certified Registered"

## 2015-08-24 ENCOUNTER — Encounter
Admission: RE | Disposition: A | Discharge status: Skilled Nursing Facility | Payer: Self-pay | Source: Ambulatory Visit | Attending: Orthopedic Surgery

## 2015-08-24 ENCOUNTER — Inpatient Hospital Stay
Admission: RE | Admit: 2015-08-24 | Discharge: 2015-08-27 | DRG: 470 | Disposition: A | Discharge status: Skilled Nursing Facility | Payer: Medicare Other | Source: Ambulatory Visit | Attending: Orthopedic Surgery | Admitting: Orthopedic Surgery

## 2015-08-24 DIAGNOSIS — Z23 Encounter for immunization: Secondary | ICD-10-CM

## 2015-08-24 DIAGNOSIS — K649 Unspecified hemorrhoids: Secondary | ICD-10-CM | POA: Diagnosis present

## 2015-08-24 DIAGNOSIS — Z5189 Encounter for other specified aftercare: Secondary | ICD-10-CM | POA: Diagnosis not present

## 2015-08-24 DIAGNOSIS — E871 Hypo-osmolality and hyponatremia: Secondary | ICD-10-CM | POA: Diagnosis present

## 2015-08-24 DIAGNOSIS — I10 Essential (primary) hypertension: Secondary | ICD-10-CM | POA: Diagnosis present

## 2015-08-24 DIAGNOSIS — K219 Gastro-esophageal reflux disease without esophagitis: Secondary | ICD-10-CM | POA: Diagnosis present

## 2015-08-24 DIAGNOSIS — Z96651 Presence of right artificial knee joint: Secondary | ICD-10-CM | POA: Diagnosis not present

## 2015-08-24 DIAGNOSIS — Z471 Aftercare following joint replacement surgery: Secondary | ICD-10-CM | POA: Diagnosis not present

## 2015-08-24 DIAGNOSIS — K210 Gastro-esophageal reflux disease with esophagitis: Secondary | ICD-10-CM | POA: Diagnosis not present

## 2015-08-24 DIAGNOSIS — M1711 Unilateral primary osteoarthritis, right knee: Principal | ICD-10-CM | POA: Diagnosis present

## 2015-08-24 DIAGNOSIS — T148 Other injury of unspecified body region: Secondary | ICD-10-CM | POA: Diagnosis not present

## 2015-08-24 DIAGNOSIS — M6281 Muscle weakness (generalized): Secondary | ICD-10-CM | POA: Diagnosis not present

## 2015-08-24 DIAGNOSIS — G8918 Other acute postprocedural pain: Secondary | ICD-10-CM

## 2015-08-24 HISTORY — PX: TOTAL KNEE ARTHROPLASTY: SHX125

## 2015-08-24 LAB — URINE DRUG SCREEN, QUALITATIVE (ARMC ONLY)
AMPHETAMINES, UR SCREEN: NOT DETECTED
BENZODIAZEPINE, UR SCRN: NOT DETECTED
Barbiturates, Ur Screen: NOT DETECTED
CANNABINOID 50 NG, UR ~~LOC~~: NOT DETECTED
Cocaine Metabolite,Ur ~~LOC~~: NOT DETECTED
MDMA (Ecstasy)Ur Screen: NOT DETECTED
Methadone Scn, Ur: NOT DETECTED
Opiate, Ur Screen: NOT DETECTED
PHENCYCLIDINE (PCP) UR S: NOT DETECTED
TRICYCLIC, UR SCREEN: NOT DETECTED

## 2015-08-24 LAB — CREATININE, SERUM
Creatinine, Ser: 1.08 mg/dL — ABNORMAL HIGH (ref 0.44–1.00)
GFR calc Af Amer: >60 mL/min (ref >60)
GFR calc non Af Amer: 53 mL/min — ABNORMAL LOW (ref >60)

## 2015-08-24 LAB — CBC
HCT: 34.6 % — ABNORMAL LOW (ref 35.0–47.0)
Hemoglobin: 11.1 g/dL — ABNORMAL LOW (ref 12.0–16.0)
MCH: 26 pg (ref 26.0–34.0)
MCHC: 31.9 g/dL — ABNORMAL LOW (ref 32.0–36.0)
MCV: 81.3 fL (ref 80.0–100.0)
Platelets: 320 10*3/uL (ref 150–440)
RBC: 4.26 MIL/uL (ref 3.80–5.20)
RDW: 15.9 % — ABNORMAL HIGH (ref 11.5–14.5)
WBC: 12.2 10*3/uL — ABNORMAL HIGH (ref 3.6–11.0)

## 2015-08-24 LAB — TYPE AND SCREEN
ABO/RH(D): B POS
ANTIBODY SCREEN: NEGATIVE

## 2015-08-24 SURGERY — ARTHROPLASTY, KNEE, TOTAL
Anesthesia: Spinal | Site: Knee | Laterality: Right | Wound class: Clean

## 2015-08-24 MED ORDER — MORPHINE SULFATE (PF) 2 MG/ML IV SOLN
2.0000 mg | INTRAVENOUS | Status: DC | PRN
  Administered 2015-08-24 (×2): 2 mg via INTRAVENOUS
  Filled 2015-08-24: qty 1

## 2015-08-24 MED ORDER — MORPHINE SULFATE (PF) 2 MG/ML IV SOLN
2.0000 mg | INTRAVENOUS | Status: DC | PRN
  Administered 2015-08-24 – 2015-08-25 (×5): 2 mg via INTRAVENOUS
  Filled 2015-08-24 (×5): qty 1

## 2015-08-24 MED ORDER — SODIUM CHLORIDE 0.9 % IJ SOLN
INTRAMUSCULAR | Status: AC
  Filled 2015-08-24: qty 50

## 2015-08-24 MED ORDER — ZOLPIDEM TARTRATE 5 MG PO TABS
5.0000 mg | ORAL_TABLET | Freq: Every evening | ORAL | Status: DC | PRN
  Administered 2015-08-25 – 2015-08-26 (×2): 5 mg via ORAL
  Filled 2015-08-24 (×2): qty 1

## 2015-08-24 MED ORDER — DIPHENHYDRAMINE HCL 12.5 MG/5ML PO ELIX: 12.5000 mg | ORAL_SOLUTION | Freq: Four times a day (QID) | ORAL | Status: DC | PRN

## 2015-08-24 MED ORDER — NEOMYCIN-POLYMYXIN B GU 40-200000 IR SOLN
Status: DC | PRN
  Administered 2015-08-24: 16 mL

## 2015-08-24 MED ORDER — SCOPOLAMINE 1 MG/3DAYS TD PT72
1.0000 | MEDICATED_PATCH | TRANSDERMAL | Status: DC
  Administered 2015-08-24 – 2015-08-27 (×2): 1.5 mg via TRANSDERMAL
  Filled 2015-08-24: qty 1

## 2015-08-24 MED ORDER — MORPHINE SULFATE 2 MG/ML IV SOLN: INTRAVENOUS | Status: DC

## 2015-08-24 MED ORDER — BUPIVACAINE-EPINEPHRINE (PF) 0.25% -1:200000 IJ SOLN
INTRAMUSCULAR | Status: DC | PRN
  Administered 2015-08-24: 30 mL

## 2015-08-24 MED ORDER — ONDANSETRON HCL 4 MG PO TABS: 4.0000 mg | ORAL_TABLET | Freq: Four times a day (QID) | ORAL | Status: DC | PRN

## 2015-08-24 MED ORDER — LIDOCAINE HCL (PF) 2 % IJ SOLN
INTRAMUSCULAR | Status: DC | PRN
  Administered 2015-08-24: 50 mg

## 2015-08-24 MED ORDER — ACETAMINOPHEN 10 MG/ML IV SOLN
INTRAVENOUS | Status: AC
  Filled 2015-08-24: qty 100

## 2015-08-24 MED ORDER — EPHEDRINE SULFATE 50 MG/ML IJ SOLN
INTRAMUSCULAR | Status: DC | PRN
  Administered 2015-08-24: 10 mg via INTRAVENOUS

## 2015-08-24 MED ORDER — SCOPOLAMINE 1 MG/3DAYS TD PT72
MEDICATED_PATCH | TRANSDERMAL | Status: AC
  Filled 2015-08-24: qty 1

## 2015-08-24 MED ORDER — OXYCODONE HCL 5 MG PO TABS
5.0000 mg | ORAL_TABLET | ORAL | Status: DC | PRN
  Administered 2015-08-24: 10 mg via ORAL
  Administered 2015-08-24: 5 mg via ORAL
  Administered 2015-08-24: 10 mg via ORAL
  Administered 2015-08-24: 5 mg via ORAL
  Administered 2015-08-25 (×6): 10 mg via ORAL
  Administered 2015-08-26 (×2): 5 mg via ORAL
  Administered 2015-08-26: 10 mg via ORAL
  Administered 2015-08-26 (×2): 5 mg via ORAL
  Administered 2015-08-26 (×2): 10 mg via ORAL
  Administered 2015-08-27 (×4): 5 mg via ORAL
  Administered 2015-08-27: 10 mg via ORAL
  Filled 2015-08-24 (×4): qty 1
  Filled 2015-08-24 (×3): qty 2
  Filled 2015-08-24: qty 1
  Filled 2015-08-24 (×4): qty 2
  Filled 2015-08-24 (×2): qty 1
  Filled 2015-08-24: qty 2
  Filled 2015-08-24 (×2): qty 1
  Filled 2015-08-24 (×4): qty 2
  Filled 2015-08-24: qty 1

## 2015-08-24 MED ORDER — LISINOPRIL 20 MG PO TABS
20.0000 mg | ORAL_TABLET | Freq: Every day | ORAL | Status: DC
  Administered 2015-08-24 – 2015-08-27 (×4): 20 mg via ORAL
  Filled 2015-08-24 (×4): qty 1

## 2015-08-24 MED ORDER — MORPHINE SULFATE (PF) 2 MG/ML IV SOLN
INTRAVENOUS | Status: AC
  Administered 2015-08-24: 2 mg via INTRAVENOUS
  Filled 2015-08-24: qty 1

## 2015-08-24 MED ORDER — HYDROMORPHONE HCL 1 MG/ML IJ SOLN: 0.2500 mg | INTRAMUSCULAR | Status: DC | PRN

## 2015-08-24 MED ORDER — MORPHINE SULFATE (PF) 10 MG/ML IV SOLN
INTRAVENOUS | Status: AC
  Filled 2015-08-24: qty 1

## 2015-08-24 MED ORDER — ENOXAPARIN SODIUM 30 MG/0.3ML ~~LOC~~ SOLN
30.0000 mg | Freq: Two times a day (BID) | SUBCUTANEOUS | Status: DC
  Administered 2015-08-25 – 2015-08-27 (×5): 30 mg via SUBCUTANEOUS
  Filled 2015-08-24 (×5): qty 0.3

## 2015-08-24 MED ORDER — PHENOL 1.4 % MT LIQD
1.0000 | OROMUCOSAL | Status: DC | PRN
  Filled 2015-08-24: qty 177

## 2015-08-24 MED ORDER — SODIUM CHLORIDE 0.9% FLUSH: 9.0000 mL | INTRAVENOUS | Status: DC | PRN

## 2015-08-24 MED ORDER — METOCLOPRAMIDE HCL 5 MG/ML IJ SOLN: 5.0000 mg | Freq: Three times a day (TID) | INTRAMUSCULAR | Status: DC | PRN

## 2015-08-24 MED ORDER — TRANEXAMIC ACID 1000 MG/10ML IV SOLN
INTRAVENOUS | Status: AC
  Filled 2015-08-24: qty 10

## 2015-08-24 MED ORDER — OXYCODONE HCL ER 15 MG PO T12A
15.0000 mg | EXTENDED_RELEASE_TABLET | Freq: Two times a day (BID) | ORAL | Status: DC
  Administered 2015-08-24 – 2015-08-26 (×6): 15 mg via ORAL
  Filled 2015-08-24 (×6): qty 1

## 2015-08-24 MED ORDER — LIDOCAINE HCL 2 % EX GEL
CUTANEOUS | Status: DC | PRN
  Administered 2015-08-24: 1 via TOPICAL

## 2015-08-24 MED ORDER — NEOMYCIN-POLYMYXIN B GU 40-200000 IR SOLN
Status: AC
  Filled 2015-08-24: qty 20

## 2015-08-24 MED ORDER — CEFAZOLIN SODIUM-DEXTROSE 2-3 GM-% IV SOLR
INTRAVENOUS | Status: AC
  Filled 2015-08-24: qty 50

## 2015-08-24 MED ORDER — KETAMINE HCL 10 MG/ML IJ SOLN
INTRAMUSCULAR | Status: DC | PRN
  Administered 2015-08-24: 50 mg via INTRAVENOUS

## 2015-08-24 MED ORDER — PHENYLEPHRINE HCL 10 MG/ML IJ SOLN
INTRAMUSCULAR | Status: DC | PRN
  Administered 2015-08-24 (×5): 100 ug via INTRAVENOUS

## 2015-08-24 MED ORDER — HYDROCHLOROTHIAZIDE 25 MG PO TABS
25.0000 mg | ORAL_TABLET | Freq: Every day | ORAL | Status: DC
  Administered 2015-08-24 – 2015-08-26 (×3): 25 mg via ORAL
  Filled 2015-08-24 (×3): qty 1

## 2015-08-24 MED ORDER — CEFAZOLIN SODIUM-DEXTROSE 2-3 GM-% IV SOLR
2.0000 g | Freq: Four times a day (QID) | INTRAVENOUS | Status: AC
  Administered 2015-08-24 (×2): 2 g via INTRAVENOUS
  Filled 2015-08-24 (×2): qty 50

## 2015-08-24 MED ORDER — DIPHENHYDRAMINE HCL 50 MG/ML IJ SOLN: 12.5000 mg | Freq: Four times a day (QID) | INTRAMUSCULAR | Status: DC | PRN

## 2015-08-24 MED ORDER — ALUM & MAG HYDROXIDE-SIMETH 200-200-20 MG/5ML PO SUSP: 30.0000 mL | ORAL | Status: DC | PRN

## 2015-08-24 MED ORDER — ONDANSETRON HCL 4 MG/2ML IJ SOLN: 4.0000 mg | Freq: Four times a day (QID) | INTRAMUSCULAR | Status: DC | PRN

## 2015-08-24 MED ORDER — BUPIVACAINE LIPOSOME 1.3 % IJ SUSP
INTRAMUSCULAR | Status: AC
  Filled 2015-08-24: qty 20

## 2015-08-24 MED ORDER — BACITRACIN ZINC 500 UNIT/GM EX OINT
TOPICAL_OINTMENT | Freq: Two times a day (BID) | CUTANEOUS | Status: DC
  Administered 2015-08-24 – 2015-08-26 (×5): 15.5556 via TOPICAL
  Filled 2015-08-24: qty 28.35

## 2015-08-24 MED ORDER — SODIUM CHLORIDE 0.9 % IV SOLN
INTRAVENOUS | Status: DC | PRN
  Administered 2015-08-24: 60 mL

## 2015-08-24 MED ORDER — FENTANYL CITRATE (PF) 100 MCG/2ML IJ SOLN: 25.0000 ug | INTRAMUSCULAR | Status: DC | PRN

## 2015-08-24 MED ORDER — BISACODYL 5 MG PO TBEC: 5.0000 mg | DELAYED_RELEASE_TABLET | Freq: Every day | ORAL | Status: DC | PRN

## 2015-08-24 MED ORDER — ACETAMINOPHEN 650 MG RE SUPP: 650.0000 mg | Freq: Four times a day (QID) | RECTAL | Status: DC | PRN

## 2015-08-24 MED ORDER — PROPOFOL 500 MG/50ML IV EMUL
INTRAVENOUS | Status: DC | PRN
Start: 2015-08-24 — End: 2015-08-24
  Administered 2015-08-24: 75 ug/kg/min via INTRAVENOUS

## 2015-08-24 MED ORDER — SODIUM CHLORIDE 0.9 % IV SOLN
INTRAVENOUS | Status: DC
  Administered 2015-08-24 – 2015-08-26 (×5): via INTRAVENOUS

## 2015-08-24 MED ORDER — CEFAZOLIN SODIUM-DEXTROSE 2-3 GM-% IV SOLR
2.0000 g | Freq: Once | INTRAVENOUS | Status: AC
  Administered 2015-08-24: 2 g via INTRAVENOUS

## 2015-08-24 MED ORDER — MENTHOL 3 MG MT LOZG
1.0000 | LOZENGE | OROMUCOSAL | Status: DC | PRN
  Filled 2015-08-24: qty 9

## 2015-08-24 MED ORDER — ONDANSETRON HCL 4 MG/2ML IJ SOLN: 4.0000 mg | Freq: Once | INTRAMUSCULAR | Status: DC | PRN

## 2015-08-24 MED ORDER — MAGNESIUM HYDROXIDE 400 MG/5ML PO SUSP
30.0000 mL | Freq: Every day | ORAL | Status: DC | PRN
  Administered 2015-08-26: 30 mL via ORAL
  Filled 2015-08-24: qty 30

## 2015-08-24 MED ORDER — BUPIVACAINE-EPINEPHRINE (PF) 0.25% -1:200000 IJ SOLN
INTRAMUSCULAR | Status: AC
  Filled 2015-08-24: qty 30

## 2015-08-24 MED ORDER — MAGNESIUM CITRATE PO SOLN
1.0000 | Freq: Once | ORAL | Status: DC | PRN
  Filled 2015-08-24: qty 296

## 2015-08-24 MED ORDER — NALOXONE HCL 0.4 MG/ML IJ SOLN: 0.4000 mg | INTRAMUSCULAR | Status: DC | PRN

## 2015-08-24 MED ORDER — GLYCOPYRROLATE 0.2 MG/ML IJ SOLN
INTRAMUSCULAR | Status: DC | PRN
  Administered 2015-08-24: 0.2 mg via INTRAVENOUS

## 2015-08-24 MED ORDER — CYCLOBENZAPRINE HCL 10 MG PO TABS
5.0000 mg | ORAL_TABLET | Freq: Two times a day (BID) | ORAL | Status: DC | PRN
  Administered 2015-08-24 (×2): 5 mg via ORAL
  Administered 2015-08-25: 10 mg via ORAL
  Administered 2015-08-25 – 2015-08-26 (×2): 5 mg via ORAL
  Filled 2015-08-24 (×5): qty 1

## 2015-08-24 MED ORDER — TRANEXAMIC ACID 1000 MG/10ML IV SOLN
1000.0000 mg | INTRAVENOUS | Status: AC
  Administered 2015-08-24: 1000 mg via INTRAVENOUS
  Filled 2015-08-24: qty 10

## 2015-08-24 MED ORDER — BUPIVACAINE HCL (PF) 0.5 % IJ SOLN
INTRAMUSCULAR | Status: DC | PRN
  Administered 2015-08-24: 3 mL via INTRATHECAL

## 2015-08-24 MED ORDER — FENTANYL CITRATE (PF) 100 MCG/2ML IJ SOLN
INTRAMUSCULAR | Status: DC | PRN
  Administered 2015-08-24 (×2): 50 ug via INTRAVENOUS

## 2015-08-24 MED ORDER — METOCLOPRAMIDE HCL 5 MG PO TABS
5.0000 mg | ORAL_TABLET | Freq: Three times a day (TID) | ORAL | Status: DC | PRN
  Administered 2015-08-24: 10 mg via ORAL
  Filled 2015-08-24: qty 2

## 2015-08-24 MED ORDER — ACETAMINOPHEN 325 MG PO TABS: 650.0000 mg | ORAL_TABLET | Freq: Four times a day (QID) | ORAL | Status: DC | PRN

## 2015-08-24 MED ORDER — PANTOPRAZOLE SODIUM 40 MG PO TBEC
80.0000 mg | DELAYED_RELEASE_TABLET | Freq: Every day | ORAL | Status: DC
  Administered 2015-08-24 – 2015-08-27 (×4): 80 mg via ORAL
  Filled 2015-08-24 (×4): qty 2

## 2015-08-24 MED ORDER — DOCUSATE SODIUM 100 MG PO CAPS
100.0000 mg | ORAL_CAPSULE | Freq: Two times a day (BID) | ORAL | Status: DC
  Administered 2015-08-24 – 2015-08-27 (×7): 100 mg via ORAL
  Filled 2015-08-24 (×7): qty 1

## 2015-08-24 MED ORDER — BUPIVACAINE HCL (PF) 0.5 % IJ SOLN
INTRAMUSCULAR | Status: AC
  Filled 2015-08-24: qty 30

## 2015-08-24 MED ORDER — LISINOPRIL-HYDROCHLOROTHIAZIDE 20-25 MG PO TABS: 1.0000 | ORAL_TABLET | Freq: Every morning | ORAL | Status: DC

## 2015-08-24 MED ORDER — MIDAZOLAM HCL 5 MG/5ML IJ SOLN
INTRAMUSCULAR | Status: DC | PRN
  Administered 2015-08-24: 2 mg via INTRAVENOUS

## 2015-08-24 MED ORDER — LACTATED RINGERS IV SOLN
INTRAVENOUS | Status: DC
  Administered 2015-08-24 (×2): via INTRAVENOUS

## 2015-08-24 MED ORDER — MORPHINE SULFATE 10 MG/ML IJ SOLN
INTRAMUSCULAR | Status: DC | PRN
  Administered 2015-08-24: 4 mg

## 2015-08-24 SURGICAL SUPPLY — 54 items
BANDAGE ACE 6X5 VEL STRL LF (GAUZE/BANDAGES/DRESSINGS) ×3 IMPLANT
BLADE SAW 1 (BLADE) ×3 IMPLANT
BLOCK CUTTING TIBIAL 4 RT (MISCELLANEOUS) IMPLANT
BLOCK CUTTING TIBIAL 4 RT MIS (MISCELLANEOUS) IMPLANT
BLOCK FEMUR CUTTING 4P RIGHT (MISCELLANEOUS) IMPLANT
CANISTER SUCT 1200ML W/VALVE (MISCELLANEOUS) ×3 IMPLANT
CANISTER SUCT 3000ML (MISCELLANEOUS) ×6 IMPLANT
CAPT KNEE TOTAL 3 ×3 IMPLANT
CATH FOL LEG HOLDER (MISCELLANEOUS) ×3 IMPLANT
CATH TRAY METER 16FR LF (MISCELLANEOUS) ×3 IMPLANT
CEMENT HV SMART SET (Cement) ×6 IMPLANT
CHLORAPREP W/TINT 26ML (MISCELLANEOUS) ×3 IMPLANT
COOLER POLAR GLACIER W/PUMP (MISCELLANEOUS) ×3 IMPLANT
DRAPE INCISE IOBAN 66X45 STRL (DRAPES) ×3 IMPLANT
DRAPE SHEET LG 3/4 BI-LAMINATE (DRAPES) ×6 IMPLANT
ELECT CAUTERY BLADE 6.4 (BLADE) ×3 IMPLANT
ELECT REM PT RETURN 9FT ADLT (ELECTROSURGICAL) ×3
ELECTRODE REM PT RTRN 9FT ADLT (ELECTROSURGICAL) ×1 IMPLANT
FEMUR BONE MODEL (Bone Implant) ×3 IMPLANT
GAUZE PETRO XEROFOAM 1X8 (MISCELLANEOUS) ×3 IMPLANT
GAUZE SPONGE 4X4 12PLY STRL (GAUZE/BANDAGES/DRESSINGS) ×3 IMPLANT
GLOVE BIOGEL PI IND STRL 9 (GLOVE) ×1 IMPLANT
GLOVE BIOGEL PI INDICATOR 9 (GLOVE) ×2
GLOVE SURG ORTHO 9.0 STRL STRW (GLOVE) ×3 IMPLANT
GOWN SPECIALTY ULTRA XL (MISCELLANEOUS) ×3 IMPLANT
GOWN STRL REUS W/ TWL LRG LVL3 (GOWN DISPOSABLE) ×2 IMPLANT
GOWN STRL REUS W/TWL LRG LVL3 (GOWN DISPOSABLE) ×4
HANDPIECE SUCTION TUBG SURGILV (MISCELLANEOUS) ×3 IMPLANT
HOOD PEEL AWAY FLYTE STAYCOOL (MISCELLANEOUS) ×6 IMPLANT
IMMBOLIZER KNEE 19 BLUE UNIV (SOFTGOODS) ×3 IMPLANT
IV SET EXTENSION 6 LL TADAPT (SET/KITS/TRAYS/PACK) ×3 IMPLANT
KNEE MEDACTA TIBIAL/FEMORAL BL (Knees) ×3 IMPLANT
KNIFE SCULPS 14X20 (INSTRUMENTS) ×3 IMPLANT
NDL SAFETY 18GX1.5 (NEEDLE) ×3 IMPLANT
NEEDLE SPNL 18GX3.5 QUINCKE PK (NEEDLE) ×3 IMPLANT
NEEDLE SPNL 20GX3.5 QUINCKE YW (NEEDLE) ×3 IMPLANT
NS IRRIG 1000ML POUR BTL (IV SOLUTION) ×3 IMPLANT
PACK TOTAL KNEE (MISCELLANEOUS) ×3 IMPLANT
PAD WRAPON POLAR KNEE (MISCELLANEOUS) ×1 IMPLANT
SOL .9 NS 3000ML IRR  AL (IV SOLUTION) ×2
SOL .9 NS 3000ML IRR UROMATIC (IV SOLUTION) ×1 IMPLANT
STAPLER SKIN PROX 35W (STAPLE) ×3 IMPLANT
STRAP SAFETY BODY (MISCELLANEOUS) ×3 IMPLANT
SUCTION FRAZIER HANDLE 10FR (MISCELLANEOUS) ×2
SUCTION TUBE FRAZIER 10FR DISP (MISCELLANEOUS) ×1 IMPLANT
SUT DVC 2 QUILL PDO  T11 36X36 (SUTURE) ×2
SUT DVC 2 QUILL PDO T11 36X36 (SUTURE) ×1 IMPLANT
SUT DVC QUILL MONODERM 30X30 (SUTURE) ×3 IMPLANT
SUT ETHIBOND NAB CT1 #1 30IN (SUTURE) ×3 IMPLANT
SYR 20CC LL (SYRINGE) ×3 IMPLANT
SYR 50ML LL SCALE MARK (SYRINGE) ×3 IMPLANT
TOWER CARTRIDGE SMART MIX (DISPOSABLE) ×3 IMPLANT
WATER STERILE IRR 1000ML POUR (IV SOLUTION) IMPLANT
WRAPON POLAR PAD KNEE (MISCELLANEOUS) ×3

## 2015-08-24 NOTE — H&P (Signed)
Reviewed paper H+P, will be scanned into chart. No changes noted.  

## 2015-08-24 NOTE — NC FL2 (Signed)
[place] LEVEL OF CARE SCREENING TOOL     IDENTIFICATION  Patient Name: Katherine Hartman Birthdate: 01-30-1951 Sex: female Admission Date (Current Location): 08/24/2015  [facility] and [place] Number:  Selena Lesser  ([id number]) Facility and Address:  [facility], [address], [place], [place] [address]      Provider Number: [id number]  Attending Physician Name and Address:  Hessie Knows, MD  Relative Name and Phone Number:       Current Level of Care: Hospital Recommended Level of Care: [place] Prior Approval Number:    Date Approved/Denied:   PASRR Number:  ([id number])  Discharge Plan: SNF    Current Diagnoses: Patient Active Problem List   Diagnosis Date Noted  . Primary osteoarthritis of right knee 08/24/2015    Orientation RESPIRATION BLADDER Height & Weight    Self, Time, Situation, Place  Normal Incontinent, Indwelling catheter 5\' 8"  (172.7 cm) 214 lbs.  BEHAVIORAL SYMPTOMS/MOOD NEUROLOGICAL BOWEL NUTRITION STATUS   (none )  (none ) Continent Diet (Diet: Full Liquid )  AMBULATORY STATUS COMMUNICATION OF NEEDS Skin   Extensive Assist Verbally Surgical wounds (Right Knee Incision )                       Personal Care Assistance Level of Assistance  Bathing, Feeding, Dressing Bathing Assistance: Limited assistance Feeding assistance: Independent Dressing Assistance: Limited assistance     Functional Limitations Info  Sight, Hearing, Speech Sight Info: Adequate Hearing Info: Adequate Speech Info: Adequate    SPECIAL CARE FACTORS FREQUENCY  PT (By licensed PT), OT (By licensed OT)     PT Frequency:  (5) OT Frequency:  (5)            Contractures      Additional Factors Info  Code Status, Allergies Code Status Info:  (Not on file ) Allergies Info:  (Ibuprofen)           Current Medications (08/24/2015):  This is the current hospital active medication list Current  Facility-Administered Medications  Medication Dose Route Frequency Provider Last Rate Last Dose  . 0.9 %  sodium chloride infusion   Intravenous Continuous Hessie Knows, MD      . acetaminophen (TYLENOL) tablet 650 mg  650 mg Oral Q6H PRN Hessie Knows, MD       Or  . acetaminophen (TYLENOL) suppository 650 mg  650 mg Rectal Q6H PRN Hessie Knows, MD      . alum & mag hydroxide-simeth (MAALOX/MYLANTA) 200-200-20 MG/5ML suspension 30 mL  30 mL Oral Q4H PRN Hessie Knows, MD      . bisacodyl (DULCOLAX) EC tablet 5 mg  5 mg Oral Daily PRN Hessie Knows, MD      . ceFAZolin (ANCEF) 2-3 GM-% IVPB SOLR           . ceFAZolin (ANCEF) IVPB 2 g/50 mL premix  2 g Intravenous Q6H Hessie Knows, MD      . cyclobenzaprine (FLEXERIL) tablet 5 mg  5 mg Oral BID PRN Hessie Knows, MD      . diphenhydrAMINE (BENADRYL) injection 12.5 mg  12.5 mg Intravenous Q6H PRN Hessie Knows, MD       Or  . diphenhydrAMINE (BENADRYL) 12.5 MG/5ML elixir 12.5 mg  12.5 mg Oral Q6H PRN Hessie Knows, MD      . docusate sodium (COLACE) capsule 100 mg  100 mg Oral BID Hessie Knows, MD      . Derrill Memo ON 08/25/2015] enoxaparin (  LOVENOX) injection 30 mg  30 mg Subcutaneous Q12H Hessie Knows, MD      . lactated ringers infusion   Intravenous Continuous Alvin Critchley, MD 50 mL/hr at 08/24/15 [phone]    . lisinopril-hydrochlorothiazide (PRINZIDE,ZESTORETIC) 20-25 MG per tablet 1 tablet  1 tablet Oral q morning - 10a Hessie Knows, MD      . magnesium citrate solution 1 Bottle  1 Bottle Oral Once PRN Hessie Knows, MD      . magnesium hydroxide (MILK OF MAGNESIA) suspension 30 mL  30 mL Oral Daily PRN Hessie Knows, MD      . menthol-cetylpyridinium (CEPACOL) lozenge 3 mg  1 lozenge Oral PRN Hessie Knows, MD       Or  . phenol (CHLORASEPTIC) mouth spray 1 spray  1 spray Mouth/Throat PRN Hessie Knows, MD      . metoCLOPramide (REGLAN) tablet 5-10 mg  5-10 mg Oral Q8H PRN Hessie Knows, MD       Or  . metoCLOPramide (REGLAN) injection 5-10 mg  5-10 mg  Intravenous Q8H PRN Hessie Knows, MD      . morphine (MORPHINE) 2 mg/mL PCA injection   Intravenous 6 times per day Hessie Knows, MD      . morphine 2 MG/ML injection 2 mg  2 mg Intravenous Q2H PRN Hessie Knows, MD      . naloxone [facility]) injection 0.4 mg  0.4 mg Intravenous PRN Hessie Knows, MD       And  . sodium chloride flush (NS) 0.9 % injection 9 mL  9 mL Intravenous PRN Hessie Knows, MD      . ondansetron [facility]) tablet 4 mg  4 mg Oral Q6H PRN Hessie Knows, MD       Or  . ondansetron [facility]) injection 4 mg  4 mg Intravenous Q6H PRN Hessie Knows, MD      . oxyCODONE (Oxy IR/ROXICODONE) immediate release tablet 5-10 mg  5-10 mg Oral Q3H PRN Hessie Knows, MD      . oxyCODONE (OXYCONTIN) 12 hr tablet 15 mg  15 mg Oral Q12H Hessie Knows, MD      . pantoprazole (PROTONIX) EC tablet 80 mg  80 mg Oral Daily Hessie Knows, MD      . scopolamine (TRANSDERM-SCOP) 1 MG/3DAYS 1.5 mg  1 patch Transdermal Q72H Alvin Critchley, MD   1.5 mg at 08/24/15 0649  . scopolamine (TRANSDERM-SCOP) 1 MG/3DAYS           . zolpidem (AMBIEN) tablet 5 mg  5 mg Oral QHS PRN Hessie Knows, MD         Discharge Medications: Please see discharge summary for a list of discharge medications.  Relevant Imaging Results:  Relevant Lab Results:   Additional Information  (SSN: [id number])  Loralyn Freshwater, LCSW

## 2015-08-24 NOTE — Anesthesia Postprocedure Evaluation (Signed)
Anesthesia Post Note  Patient: Katherine Hartman  Procedure(s) Performed: Procedure(s) (LRB): TOTAL KNEE ARTHROPLASTY (Right)  Patient location during evaluation: PACU Anesthesia Type: General Level of consciousness: awake and alert Pain management: pain level controlled Vital Signs Assessment: post-procedure vital signs reviewed and stable Respiratory status: spontaneous breathing, nonlabored ventilation, respiratory function stable and patient connected to nasal cannula oxygen Cardiovascular status: blood pressure returned to baseline and stable Postop Assessment: no signs of nausea or vomiting Anesthetic complications: no    Last Vitals:  Filed Vitals:   08/24/15 1020 08/24/15 1035  BP: 138/97 126/85  Pulse: 82 84  Temp:    Resp: 19 15    Last Pain: There were no vitals filed for this visit.               Molli Barrows

## 2015-08-24 NOTE — Anesthesia Preprocedure Evaluation (Addendum)
Anesthesia Evaluation  Patient identified by MRN, date of birth, ID band Patient awake    Reviewed: Allergy & Precautions, H&P , NPO status , Patient's Chart, lab work & pertinent test results, reviewed documented beta blocker date and time   History of Anesthesia Complications (+) PONV and history of anesthetic complications  Airway Mallampati: II   Neck ROM: full    Dental  (+) Poor Dentition, Upper Dentures, Lower Dentures   Pulmonary neg pulmonary ROS, Current Smoker,    Pulmonary exam normal        Cardiovascular Exercise Tolerance: Good hypertension, (-) angina(-) Past MI negative cardio ROS Normal cardiovascular exam  Old LBBB 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  + hx of cocaine at last surgery which was cancelled and neg ua today   Neuro/Psych negative neurological ROS  negative psych ROS   GI/Hepatic negative GI ROS, Neg liver ROS, GERD  ,  Endo/Other  negative endocrine ROS  Renal/GU negative Renal ROS  negative genitourinary   Musculoskeletal   Abdominal   Peds  Hematology negative hematology ROS (+)   Anesthesia Other Findings Past Medical History:   Arthritis                                                    Hypertension                                                 PONV (postoperative nausea and vomiting)                     GERD (gastroesophageal reflux disease)                     Past Surgical History:   ABDOMINAL HYSTERECTOMY                                        TUBAL LIGATION                                                BLADDER SUSPENSION                              N/A              Reproductive/Obstetrics                            Anesthesia Physical Anesthesia Plan  ASA: III  Anesthesia Plan: General and Spinal   Post-op Pain Management:    Induction:   Airway Management Planned:   Additional Equipment:   Intra-op  Plan:   Post-operative Plan:   Informed Consent: I have reviewed the patients History and Physical, chart, labs and discussed the procedure including the risks, benefits and alternatives for the proposed anesthesia with the patient or authorized representative who has indicated his/her understanding and acceptance.  Dental Advisory Given  Plan Discussed with: CRNA  Anesthesia Plan Comments:         Anesthesia Quick Evaluation

## 2015-08-24 NOTE — Anesthesia Procedure Notes (Signed)
Spinal Patient location during procedure: OR Staffing Anesthesiologist: Molli Barrows Resident/CRNA: Rolla Plate Performed by: resident/CRNA  Preanesthetic Checklist Completed: patient identified, site marked, surgical consent, pre-op evaluation, timeout performed, IV checked, risks and benefits discussed and monitors and equipment checked Spinal Block Patient position: sitting Prep: Betadine and site prepped and draped Patient monitoring: heart rate, continuous pulse ox, blood pressure and cardiac monitor Approach: midline Location: L4-5 Injection technique: single-shot Needle Needle type: [place] and Introducer  Needle gauge: 24 G Needle length: 9 cm Additional Notes Negative paresthesia. Negative blood return. Positive free-flowing CSF. Expiration date of kit checked and confirmed. Patient tolerated procedure well, without complications.

## 2015-08-24 NOTE — Op Note (Signed)
08/24/2015  9:34 AM  PATIENT:  Katherine Hartman  65 y.o. female  PRE-OPERATIVE DIAGNOSIS:  degenerative osteoarthritis right knee  POST-OPERATIVE DIAGNOSIS:  degenerative osteoarthritis right knee  PROCEDURE:  Procedure(s): TOTAL KNEE ARTHROPLASTY (Right)  SURGEON: Laurene Footman, MD  ASSISTANTS: Rachelle Hora [facility]  ANESTHESIA:   spinal  EBL:  Total I/O In: 1500 [I.V.:1500] Out: 150 [Urine:100; Blood:50]  BLOOD ADMINISTERED:none  DRAINS: none   LOCAL MEDICATIONS USED:  MARCAINE    and OTHER exparel, morphine  SPECIMEN:  Source of Specimen:  Cut ends of bone  DISPOSITION OF SPECIMEN:  PATHOLOGY  COUNTS:  YES  TOURNIQUET:   90 minutes at 300 mmHg  IMPLANTS: Medacta GMK sphere 4+ femur 4 tibia 12 mm insert and to patella  DICTATION: .Dragon Dictation patient brought the operating room and after adequate anesthesia was obtained the right leg was prepped draped in sterile fashion. After patient identification and timeout procedure completed after having prepped and draped leg tourniquet was raised to 300 mmHg. Midline skin incision made followed by medial parapatellar arthrotomy. Inspection knee revealed extensive wear of the lateral compartment and patellofemoral joint. The anterior cruciate ligament fat pad were excised and the proximal tibia was exposed to allow for the [place] cutting guide with a proximal tibia cut 2 mm less than plan secondary to the valgus deformity. The distal femur was cut using the cutting guide and the 4+ cutting block was applied and anterior posterior chamfer cuts made with no notching. The residual posterior horns of menisci removed at this time and the PCL excised. The Gertie's tubercle was then elevated with a osteotome to release the tight lateral structures. The tibial trial was placed and a size 4 fit well was pinned in position and preparation performed for short stem. The keel punch was placed and the femur was then trialed with a 4+. 12 mm insert  gave good stability. Distal femoral drill holes were made and the trochlear groove cut performed. The trials removed and the patella was cut using the patellar cutting guide and with drill holes made it was a size 2. This point the bony surfaces were thoroughly irrigated and dried after infiltration of Marcaine and morphine as well as dilute Exparel. The components were cemented into position with a 12 mm trial. When the cement was set excess cement removed and a 13 trial placed with the 12 was a better fit and a 12 mm insert placed. There is good tracking of patella with no touch technique. The arthrotomy was then repaired with heavy Ethibond along the medial retinaculum and then a heavy Quill. To a Quill subcutaneously with skin staples. Xeroform 4 x 4's web roll Ace wrap and Polar Care.  PLAN OF CARE: Admit to inpatient   PATIENT DISPOSITION:  PACU - hemodynamically stable.

## 2015-08-24 NOTE — Evaluation (Signed)
Physical Therapy Evaluation Patient Details Name: Katherine Hartman MRN: [id number] DOB: 1950-10-02 Today's Date: 08/24/2015   History of Present Illness  Pt underwent R TKR without reported post-op complications. Pt is POD#0 at time of evaluation. RN called because pt uncomfortable in bed. Pt has not had full return of sensation but is adamant about getting out of bed so PT evaluation performed earlier than what would be ideal. Pt still reports some numbness/tingling in R lower leg and foot.   Clinical Impression  PT evaluation performed prior to optimal time due to patient's insistence on getting up to recliner due to RLE pain. She reports 10/10 pain and is having difficulty remaining still in the bed. Pt does complete most of bed exercises but reports increased pain and poor tolerance to R knee flexion/extension. Measurements not obtained due to poor tolerance for movement. KI donned for all bed mobility, transfers, and limited ambulation from bed to recliner. Pt will likely need SNF placement due to weakness, difficulty with mobility, and decreased caregiver support. Pt will benefit from skilled PT services to address deficits in strength, balance, and mobility in order to return to full function at home.     Follow Up Recommendations SNF    Equipment Recommendations  Rolling walker with 5" wheels;Other (comment) (TBD further at [facility])    Recommendations for Other Services Rehab consult     Precautions / Restrictions Precautions Precautions: Knee Restrictions Weight Bearing Restrictions: Yes RLE Weight Bearing: Weight bearing as tolerated      Mobility  Bed Mobility Overal bed mobility: Needs Assistance Bed Mobility: Supine to Sit     Supine to sit: Mod assist;HOB elevated     General bed mobility comments: Use of bed rails and assist for RLE  Transfers Overall transfer level: Needs assistance Equipment used: Rolling walker (2 wheeled) Transfers: Sit to/from Stand Sit to  Stand: Mod assist         General transfer comment: Pt requires +2 present for safety. Cues for safe hand placement and sequencing. Good stability once in standing  Ambulation/Gait Ambulation/Gait assistance: Min guard Ambulation Distance (Feet): 3 Feet Assistive device: Rolling walker (2 wheeled) Gait Pattern/deviations: Step-to pattern;Decreased step length - left;Decreased stance time - right;Decreased weight shift to right Gait velocity: Decreased Gait velocity interpretation: <1.8 ft/sec, indicative of risk for recurrent falls General Gait Details: Pt demonstrates dereased willingness to accept weight to RLE. Cues required for proper sequencing with walker. Pt demonstrates good stability but reports increased pain with weight acceptance to RLE  Stairs            Wheelchair Mobility    Modified Rankin (Stroke Patients Only)       Balance Overall balance assessment: Needs assistance Sitting-balance support: No upper extremity supported Sitting balance-Leahy Scale: Good     Standing balance support: Bilateral upper extremity supported Standing balance-Leahy Scale: Poor                               Pertinent Vitals/Pain Pain Assessment: 0-10 Pain Score: 10-Worst pain ever Pain Location: R knee Pain Intervention(s): Limited activity within patient's tolerance;Monitored during session;Premedicated before session;Ice applied;Repositioned    Home Living Family/patient expects to be discharged to:: Skilled nursing facility Living Arrangements: Alone Available Help at Discharge: Family Type of Home: Apartment Home Access: Stairs to enter Entrance Stairs-Rails: None Entrance Stairs-Number of Steps: 1 Home Layout: One level Home Equipment: Other (comment) (No home equipment)  Prior Function Level of Independence: Independent         Comments: Pt reports indepenence with ADLs/IADLs     Hand Dominance   Dominant Hand: Right     Extremity/Trunk Assessment   Upper Extremity Assessment: Overall WFL for tasks assessed           Lower Extremity Assessment: RLE deficits/detail RLE Deficits / Details: Pt requires assist for SLR and SAQ on RLE. Demonstrates mild loss of R ankle DF strength having not recovered full motor function from surgery. Pt reports R lower leg and R foot numbness to light touch       Communication   Communication: No difficulties  Cognition Arousal/Alertness: Lethargic Behavior During Therapy: WFL for tasks assessed/performed Overall Cognitive Status: Within Functional Limits for tasks assessed                      General Comments      Exercises Total Joint Exercises Ankle Circles/Pumps: Strengthening;Both;10 reps;Supine Quad Sets: Strengthening;Both;10 reps;Supine Gluteal Sets: Strengthening;Both;10 reps;Supine Towel Squeeze: Strengthening;Both;10 reps;Supine Short Arc Quad: Strengthening;Right;10 reps;Supine Heel Slides: Strengthening;Right;10 reps;Supine Hip ABduction/ADduction: Strengthening;Right;10 reps;Supine Straight Leg Raises: Strengthening;Right;10 reps;Supine Goniometric ROM: Pt will not tolerate measurements currently due to 10/10 pain and decreased willingness to flex/extend R knee       Assessment/Plan    PT Assessment Patient needs continued PT services  PT Diagnosis Difficulty walking;Abnormality of gait;Generalized weakness;Acute pain   PT Problem List Decreased strength;Decreased range of motion;Decreased activity tolerance;Decreased balance;Decreased mobility;Decreased knowledge of use of DME;Decreased safety awareness;Obesity;Pain  PT Treatment Interventions DME instruction;Gait training;Stair training;Therapeutic activities;Therapeutic exercise;Balance training;Neuromuscular re-education;Patient/family education;Manual techniques   PT Goals (Current goals can be found in the Care Plan section) Acute Rehab PT Goals Patient Stated Goal: "I'm planning  on going to rehab, my knee hurts so bad!" PT Goal Formulation: With patient Time For Goal Achievement: 09/07/15 Potential to Achieve Goals: Good    Frequency BID   Barriers to discharge Decreased caregiver support Lives alone    Co-evaluation               End of Session Equipment Utilized During Treatment: Gait belt Activity Tolerance: Patient limited by pain Patient left: in chair;with call bell/phone within reach;with chair alarm set;with SCD's reapplied (polar care in place, towel roll under heel) Nurse Communication: Mobility status         Time: 1315-1350 PT Time Calculation (min) (ACUTE ONLY): 35 min   Charges:   PT Evaluation $PT Eval Moderate Complexity: 1 Procedure PT Treatments $Therapeutic Exercise: 8-22 mins   PT G Codes:       Lyndel Safe Rainee Sweatt PT, DPT   Lafawn Lenoir 08/24/2015, 2:21 PM

## 2015-08-24 NOTE — Transfer of Care (Signed)
Immediate Anesthesia Transfer of Care Note  Patient: Katherine Hartman  Procedure(s) Performed: Procedure(s): TOTAL KNEE ARTHROPLASTY (Right)  Patient Location: PACU  Anesthesia Type:Spinal  Level of Consciousness: awake  Airway & Oxygen Therapy: Patient Spontanous Breathing and Patient connected to face mask oxygen  Post-op Assessment: Report given to RN  Post vital signs: Reviewed  Last Vitals:  Filed Vitals:   08/24/15 0601 08/24/15 0935  BP: 139/94 112/62  Pulse: 99 91  Temp: 36.9 C 36.8 C  Resp: 16 16    Complications: No apparent anesthesia complications

## 2015-08-25 LAB — BASIC METABOLIC PANEL
ANION GAP: 9 (ref 5–15)
BUN: 18 mg/dL (ref 6–20)
CHLORIDE: 98 mmol/L — AB (ref 101–111)
CO2: 22 mmol/L (ref 22–32)
Calcium: 8.7 mg/dL — ABNORMAL LOW (ref 8.9–10.3)
Creatinine, Ser: 1.01 mg/dL — ABNORMAL HIGH (ref 0.44–1.00)
GFR, EST NON AFRICAN AMERICAN: 57 mL/min — AB (ref >60)
Glucose, Bld: 114 mg/dL — ABNORMAL HIGH (ref 65–99)
POTASSIUM: 4.2 mmol/L (ref 3.5–5.1)
SODIUM: 129 mmol/L — AB (ref 135–145)

## 2015-08-25 LAB — CBC
HCT: 32.2 % — ABNORMAL LOW (ref 35.0–47.0)
HEMOGLOBIN: 10.5 g/dL — AB (ref 12.0–16.0)
MCH: 26.7 pg (ref 26.0–34.0)
MCHC: 32.7 g/dL (ref 32.0–36.0)
MCV: 81.6 fL (ref 80.0–100.0)
PLATELETS: 305 10*3/uL (ref 150–440)
RBC: 3.94 MIL/uL (ref 3.80–5.20)
RDW: 15.7 % — ABNORMAL HIGH (ref 11.5–14.5)
WBC: 10.9 10*3/uL (ref 3.6–11.0)

## 2015-08-25 LAB — SODIUM: SODIUM: 132 mmol/L — AB (ref 135–145)

## 2015-08-25 NOTE — Clinical Social Work Note (Signed)
Clinical Social Work Assessment  Patient Details  Name: Katherine Hartman MRN: [id number] Date of Birth: [birth year]  Date of referral:  08/25/15               Reason for consult:  Facility Placement                Permission sought to share information with:  Chartered certified accountant granted to share information::  Yes, Verbal Permission Granted  Name::      [place]::   [place]   Relationship::     Contact Information:     Housing/Transportation Living arrangements for the past 2 months:  [place] of Information:  Patient Patient Interpreter Needed:  None Criminal Activity/Legal Involvement Pertinent to Current Situation/Hospitalization:  No - Comment as needed Significant Relationships:  Adult Children Lives with:  Self Do you feel safe going back to the place where you live?  Yes Need for family participation in patient care:  Yes (Comment)  Care giving concerns:  Patient lives alone in [place].    Social Worker assessment / plan:  Holiday representative (CSW) received SNF consult. PT is recommending SNF. CSW met with patient to discuss D/C plan. Patient was alert and oriented and laying in the bed. Patient reported that she lives alone in [place]. Patient reported that she has 4 adult sons and several grandchildren. CSW explained SNF process. Patient is agreeable to SNF search in [facility].   CSW presented bed offers to patient. [facility] chose [facility] lia[facility] is aware of accepted bed offer. CSW will continue to follow and assist as needed.   Employment status:  Retired Forensic scientist:  Information systems manager, Medicaid In [place] PT Recommendations:  [place] / Referral to community resources:  [place]  Patient/Family's Response to care:  Patient is agreeable to going to [facility] for rehab.   Patient/Family's Understanding of and Emotional Response to  Diagnosis, Current Treatment, and Prognosis:  Patient was pleasant throughout assessment and thanked CSW for visit.   Emotional Assessment Appearance:  Appears stated age Attitude/Demeanor/Rapport:    Affect (typically observed):  Accepting, Adaptable, Pleasant Orientation:  Oriented to Self, Oriented to Place, Oriented to  Time, Oriented to Situation Alcohol / Substance use:  Not Applicable Psych involvement (Current and /or in the community):  No (Comment)  Discharge Needs  Concerns to be addressed:  Discharge Planning Concerns Readmission within the last 30 days:  No Current discharge risk:  Dependent with Mobility Barriers to Discharge:  Continued Medical Work up   Katherine Freshwater, LCSW 08/25/2015, 9:50 AM

## 2015-08-25 NOTE — Progress Notes (Signed)
   Subjective: 1 Day Post-Op Procedure(s) (LRB): TOTAL KNEE ARTHROPLASTY (Right) Patient reports pain as moderate.   Patient is well, and has had no acute complaints or problems. Agitated last night. Refusing bone foam and ankle towel. Denies any CP, SOB, ABD pain. We will continue therapy today.  Plan is to go Rehab after hospital stay.  Objective: Vital signs in last 24 hours: Temp:  [97.1 F (36.2 C)-99.6 F (37.6 C)] 98.7 F (37.1 C) (01/27 0744) Pulse Rate:  [78-102] 78 (01/27 0744) Resp:  [13-20] 18 (01/27 0744) BP: (112-164)/(62-97) 164/86 mmHg (01/27 0744) SpO2:  [93 %-100 %] 98 % (01/27 0744) Weight:  [106.096 kg (233 lb 14.4 oz)] 106.096 kg (233 lb 14.4 oz) (01/26 1148)  Intake/Output from previous day: 01/26 0701 - 01/27 0700 In: 2818.8 [I.V.:2818.8] Out: 2000 [Urine:1950; Blood:50] Intake/Output this shift:     Recent Labs  08/24/15 1321 08/25/15 0421  HGB 11.1* 10.5*    Recent Labs  08/24/15 1321 08/25/15 0421  WBC 12.2* 10.9  RBC 4.26 3.94  HCT 34.6* 32.2*  PLT 320 305    Recent Labs  08/24/15 1321 08/25/15 0421  NA  --  129*  K  --  4.2  CL  --  98*  CO2  --  22  BUN  --  18  CREATININE 1.08* 1.01*  GLUCOSE  --  114*  CALCIUM  --  8.7*   No results for input(s): LABPT, INR in the last 72 hours.  EXAM General - Patient is Alert, Appropriate and Disorganized Extremity - Neurovascular intact Sensation intact distally Intact pulses distally Dorsiflexion/Plantar flexion intact Dressing - dressing C/D/I and no drainage Motor Function - intact, moving foot and toes well on exam.   Past Medical History  Diagnosis Date  . Arthritis   . Hypertension   . PONV (postoperative nausea and vomiting)   . GERD (gastroesophageal reflux disease)     Assessment/Plan:   1 Day Post-Op Procedure(s) (LRB): TOTAL KNEE ARTHROPLASTY (Right) Active Problems:   Primary osteoarthritis of right knee   hyponatremia  Estimated body mass index is  35.04 kg/(m^2) as calculated from the following:   Height as of this encounter: 5' 8.5" (1.74 m).   Weight as of this encounter: 106.096 kg (233 lb 14.4 oz). Advance diet Up with therapy  Needs BM Recheck labs in the am Fluid restriction diet, continue with NS today. Recheck Na this afternoon   DVT Prophylaxis - Lovenox, Foot Pumps and TED hose Weight-Bearing as tolerated to right leg D/C O2 and Pulse OX and try on Room Air  T. Rachelle Hora, PA-C [place] 08/25/2015, 8:13 AM

## 2015-08-25 NOTE — Clinical Social Work Placement (Signed)
   CLINICAL SOCIAL WORK PLACEMENT  NOTE  Date:  08/25/2015  Patient Details  Name: Katherine Hartman MRN: [id number] Date of Birth: 1950-09-02  Clinical Social Work is seeking post-discharge placement for this patient at the [place] level of care (*CSW will initial, date and re-position this form in  chart as items are completed):  Yes   Patient/family provided with [place] Work Department's list of facilities offering this level of care within the geographic area requested by the patient (or if unable, by the patient's family).  Yes   Patient/family informed of their freedom to choose among providers that offer the needed level of care, that participate in Medicare, Medicaid or managed care program needed by the patient, have an available bed and are willing to accept the patient.  Yes   Patient/family informed of [place]'s ownership interest in [facility] and [facility], as well as of the fact that they are under no obligation to receive care at these facilities.  PASRR submitted to EDS on 08/25/15     PASRR number received on 08/25/15     Existing PASRR number confirmed on       FL2 transmitted to all facilities in geographic area requested by pt/family on 08/25/15     FL2 transmitted to all facilities within larger geographic area on       Patient informed that his/her managed care company has contracts with or will negotiate with certain facilities, including the following:        Yes   Patient/family informed of bed offers received.  Patient chooses bed at  ([facility] )     Physician recommends and patient chooses bed at      Patient to be transferred to   on  .  Patient to be transferred to facility by       Patient family notified on   of transfer.  Name of family member notified:        PHYSICIAN       Additional Comment:    _______________________________________________ Loralyn Freshwater, LCSW 08/25/2015, 9:48  AM

## 2015-08-25 NOTE — Evaluation (Signed)
Occupational Therapy Evaluation Patient Details Name: Katherine Hartman MRN: [id number] DOB: [birth year] Today's Date: 08/25/2015    History of Present Illness Pt underwent R TKR without reported post-op complications. Pt is POD#0 at time of evaluation. RN called because pt uncomfortable in bed. Pt has not had full return of sensation but is adamant about getting out of bed so PT evaluation performed earlier than what would be ideal. Pt still reports some numbness/tingling in R lower leg and foot.    Clinical Impression   This patient is a 65 year old female who came to [facility] for a R total knee replacement.  Patient lives in a one story apartment  alone with 1 steps to enter.  She had been independent with ADL and functional mobility. She now demonstrates deficits in pain, mobility, and activities of daily living. She now requires assistance and would benefit from Occupational Therapy for ADL/functional mobility training.      Follow Up Recommendations  SNF    Equipment Recommendations       Recommendations for Other Services       Precautions / Restrictions Precautions Precautions: Knee Restrictions RLE Weight Bearing: Weight bearing as tolerated      Mobility Bed Mobility                  Transfers                      Balance                                            ADL                                         General ADL Comments: Patient had been independent . Practiced techniques for lower body dressing using hip kit to Donned/doffed socks and pants to knees. Patient with minimal participation and falling asleep.      Vision     Perception     Praxis      Pertinent Vitals/Pain Pain Score: 10-Worst pain ever     Hand Dominance Right   Extremity/Trunk Assessment Upper Extremity Assessment Upper Extremity Assessment: Overall WFL for tasks assessed   Lower Extremity  Assessment Lower Extremity Assessment: Defer to PT evaluation       Communication Communication Communication: No difficulties   Cognition Arousal/Alertness: Lethargic (Patient falls asleep during evaluation) Behavior During Therapy: WFL for tasks assessed/performed Overall Cognitive Status: Within Functional Limits for tasks assessed                     General Comments       Exercises       Shoulder Instructions      Home Living Family/patient expects to be discharged to:: Skilled nursing facility Living Arrangements: Alone Available Help at Discharge: Family Type of Home: Apartment Home Access: Stairs to enter Technical brewer of Steps: 1 Entrance Stairs-Rails: None Home Layout: One level     Bathroom Shower/Tub: Teacher, early years/pre: Standard                Prior Functioning/Environment Level of Independence: Independent             OT Diagnosis: Acute  pain   OT Problem List:     OT Treatment/Interventions: Self-care/ADL training    OT Goals(Current goals can be found in the care plan section) Acute Rehab OT Goals Patient Stated Goal: rehab OT Goal Formulation: With patient Time For Goal Achievement: 09/08/15 Potential to Achieve Goals: Good  OT Frequency: Min 1X/week   Barriers to D/C:            Co-evaluation              End of Session Equipment Utilized During Treatment:  (hip kit)  Activity Tolerance: Patient limited by pain Patient left: in bed;with call bell/phone within reach;with bed alarm set   Time: 1040-1055 OT Time Calculation (min): 15 min Charges:  OT General Charges $OT Visit: 1 Procedure OT Evaluation $OT Eval Low Complexity: 1 Procedure G-Codes:    Myrene Galas, MS/OTR/L   08/25/2015, 11:05 AM

## 2015-08-25 NOTE — Progress Notes (Signed)
Physical Therapy Treatment Patient Details Name: Katherine Hartman MRN: [id number] DOB: 13-Jun-1951 Today's Date: 08/25/2015    History of Present Illness Pt underwent R TKR without reported post-op complications. RN called because pt uncomfortable in bed. Pt has not had full return of sensation but is adamant about getting out of bed so PT evaluation performed earlier than what would be ideal. Pt still reports some numbness/tingling in R lower leg and foot.     PT Comments    Pt is very pain focused t/o session and needs constant cuing and encouragement with even basic acts.  She shows better than expected ability to tolerate standing/WBing and some minimal ambulation but needs very frequent rest breaks and excessive cuing secondary to c/o severe pain with any R LE activity.    Follow Up Recommendations  SNF     Equipment Recommendations  Rolling walker with 5" wheels    Recommendations for Other Services       Precautions / Restrictions Precautions Precautions: Knee Required Braces or Orthoses: Knee Immobilizer - Right Restrictions RLE Weight Bearing: Weight bearing as tolerated    Mobility  Bed Mobility Overal bed mobility: Needs Assistance Bed Mobility: Supine to Sit     Supine to sit: Max assist     General bed mobility comments: Pt in considerable pain t/o the session and generally does not show ability to assist much with mobility  Transfers Overall transfer level: Needs assistance Equipment used: Rolling walker (2 wheeled) Transfers: Sit to/from Stand Sit to Stand: Mod assist         General transfer comment: Pt struggles to get to standing, able to shift weight to the walker fairly well for maintaining standing.   Ambulation/Gait Ambulation/Gait assistance: Mod assist (chair following) Ambulation Distance (Feet): 15 Feet Assistive device: Rolling walker (2 wheeled) (R KI donned)       General Gait Details: Pt is able to ambulate with heavy walker use and  constant cuing but does step-to pattern with much hesitancy with R WBing but she is actually able to advance walker and go forward and back a few steps w/o excessively heavy assist. Pt c/o pain t/o the entire episode.   Stairs            Wheelchair Mobility    Modified Rankin (Stroke Patients Only)       Balance                                    Cognition Arousal/Alertness: Lethargic Behavior During Therapy: Restless;Anxious Overall Cognitive Status: Within Functional Limits for tasks assessed                      Exercises Total Joint Exercises Ankle Circles/Pumps: Strengthening;Both;10 reps;Supine Quad Sets: Strengthening;Both;10 reps;Supine Gluteal Sets: Strengthening;Both;10 reps;Supine Short Arc Quad: AAROM;10 reps;Right;Supine Heel Slides: AAROM;5 reps;Right Hip ABduction/ADduction: AAROM;AROM;10 reps Knee Flexion: 5 reps;PROM Goniometric ROM: Pt lacking full extension (~10 deg) and struggles to tolerate any flexion >~50 degrees.    General Comments        Pertinent Vitals/Pain Pain Score: 10-Worst pain ever Pain Location: thigh, knee, and generally the entire [place] expects to be discharged to:: Skilled nursing facility Living Arrangements: Alone Available Help at Discharge: Family Type of Home: Apartment Home Access: Stairs to enter Entrance Stairs-Rails: None Home Layout: One level  Prior Function Level of Independence: Independent          PT Goals (current goals can now be found in the care plan section) Acute Rehab PT Goals Patient Stated Goal: rehab Progress towards PT goals: Progressing toward goals    Frequency  BID    PT Plan Current plan remains appropriate    Co-evaluation             End of Session Equipment Utilized During Treatment: Gait belt Activity Tolerance: Patient limited by pain Patient left: in chair;with call bell/phone within reach;with chair alarm  set;with SCD's reapplied     Time: [id number] PT Time Calculation (min) (ACUTE ONLY): 42 min  Charges:  $Gait Training: 8-22 mins $Therapeutic Exercise: 8-22 mins $Therapeutic Activity: 8-22 mins                    G Codes:     Wayne Both, PT, DPT [phone]  Kreg Shropshire 08/25/2015, 1:16 PM

## 2015-08-25 NOTE — Progress Notes (Signed)
Pt. Alert and oriented. VSS. Pt. Has been in a great deal of pain throughout the night. Pain meds given as ordered on MAR. Pt. Repositioned and sit on side of bed to make more comfortable. Pt. C/o wrap around right knee feeling too tight.Kevan Ny was checked multiple times. Fingers will fit under wrap. Pulse moderate. Foot warm and color normal. Neurochecks WDL. Foley patent and draining clear yellow urine. Polarcare on and running. IS at the bedside and pt. Encouraged to use it. Resting quietly at this time. Will continue to monitor.

## 2015-08-25 NOTE — Progress Notes (Signed)
Foley d/c'd at 0610 with 50cc output

## 2015-08-25 NOTE — Progress Notes (Signed)
Physical Therapy Treatment Patient Details Name: Katherine Hartman MRN: [id number] DOB: 03-13-1951 Today's Date: 08/25/2015    History of Present Illness Pt underwent R TKR without reported post-op complications. RN called because pt uncomfortable in bed. Pt has not had full return of sensation but is adamant about getting out of bed so PT evaluation performed earlier than what would be ideal. Pt still reports some numbness/tingling in R lower leg and foot.     PT Comments    Pt continues to be very pain focused and limited.  She was more awake this afternoon, but still clearly drowsy and minimally confused.  She is able to ambulate with much more confidence though she remains slow and hesitant.  Pt continues to struggle significantly with ROM acts and generally with pain during any R LE movement.   Follow Up Recommendations  SNF     Equipment Recommendations  Rolling walker with 5" wheels    Recommendations for Other Services       Precautions / Restrictions Precautions Required Braces or Orthoses: Knee Immobilizer - Right Restrictions RLE Weight Bearing: Weight bearing as tolerated    Mobility  Bed Mobility Overal bed mobility: Needs Assistance Bed Mobility: Supine to Sit;Sit to Supine     Supine to sit: Mod assist Sit to supine: Mod assist;Max assist   General bed mobility comments: Pt does not seem as uncomfortable with transitions this afternoon but is still very pain focused and limited  Transfers Overall transfer level: Needs assistance Equipment used: Rolling walker (2 wheeled) Transfers: Sit to/from Stand Sit to Stand: Mod assist         General transfer comment: Pt again needing some assist to get to standing, but was able to rise with much cuing, set up and encouragement.  Ambulation/Gait Ambulation/Gait assistance: Min assist Ambulation Distance (Feet): 20 Feet Assistive device: Rolling walker (2 wheeled)       General Gait Details: Pt did  considerably better with ambulation this afternoon and though she was in a lot of pain she did not need heavy assist, was able to turn 180 degrees safety and needed much less cuing for appropriate cadence and sequencing. Pt still very slow and pain limited.    Stairs            Wheelchair Mobility    Modified Rankin (Stroke Patients Only)       Balance                                    Cognition Arousal/Alertness: Lethargic Behavior During Therapy: Restless;Anxious Overall Cognitive Status: Within Functional Limits for tasks assessed                      Exercises Total Joint Exercises Ankle Circles/Pumps: Strengthening;Both;10 reps;Supine Quad Sets: Strengthening;Both;10 reps;Supine Gluteal Sets: Strengthening;Both;10 reps;Supine Heel Slides: AAROM;5 reps;Right Hip ABduction/ADduction: AAROM;AROM;10 reps Knee Flexion: 5 reps;PROM    General Comments        Pertinent Vitals/Pain Pain Score: 10-Worst pain ever    Home Living                      Prior Function            PT Goals (current goals can now be found in the care plan section) Progress towards PT goals: Progressing toward goals    Frequency  BID    PT Plan  Current plan remains appropriate    Co-evaluation             End of Session Equipment Utilized During Treatment: Gait belt Activity Tolerance: Patient limited by pain Patient left: with call bell/phone within reach;with bed alarm set     Time: [id number] PT Time Calculation (min) (ACUTE ONLY): 27 min  Charges:  $Gait Training: 8-22 mins $Therapeutic Exercise: 8-22 mins                    G Codes:     Wayne Both, PT, DPT [phone]  Kreg Shropshire 08/25/2015, 5:18 PM

## 2015-08-25 NOTE — Care Management Note (Signed)
Case Management Note  Patient Details  Name: Katherine Hartman MRN: [id number] Date of Birth: October 18, 1950  Subjective/Objective:                  Met with patient to discuss discharge planning. She plans to go to SNF at discharge. CSW said [facility] has offered a SNF bed. Patient states she is from home alone. She also states she has not DME available to use. She uses [facility] for Rx [phone].  Action/Plan: List of home health agencies left with patient. No RNCM needs at this time. Case closed.   Expected Discharge Date:                  Expected Discharge Plan:     In-House Referral:     Discharge planning Services  CM Consult  Post Acute Care Choice:    Choice offered to:  Patient  DME Arranged:    DME Agency:     HH Arranged:    [place] Agency:     Status of Service:  Completed, signed off  Medicare Important Message Given:    Date Medicare IM Given:    Medicare IM give by:    Date Additional Medicare IM Given:    Additional Medicare Important Message give by:     If discussed at [place] of Stay Meetings, dates discussed:    Additional Comments:  Marshell Garfinkel, RN 08/25/2015, 11:44 AM

## 2015-08-25 NOTE — Discharge Summary (Signed)
Physician Discharge Summary  Patient ID: Katherine Hartman MRN: [id number] DOB/AGE: [birth year] 66 y.o.  Admit date: 08/24/2015 Discharge date: 08/27/15  Admission Diagnoses:  degenerative osteoarthritis   Discharge Diagnoses: Patient Active Problem List   Diagnosis Date Noted  . Primary osteoarthritis of right knee 08/24/2015    Past Medical History  Diagnosis Date  . Arthritis   . Hypertension   . PONV (postoperative nausea and vomiting)   . GERD (gastroesophageal reflux disease)      Transfusion: none   Consultants (if any):    Discharged Condition: Improved  Hospital Course: Katherine Hartman is an 65 y.o. female who was admitted 08/24/2015 with a diagnosis of right knee DJD and went to the operating room on 08/24/2015 and underwent the above named procedures.    Surgeries: Procedure(s): TOTAL KNEE ARTHROPLASTY on 08/24/2015 Patient tolerated the surgery well. Taken to PACU where she was stabilized and then transferred to the orthopedic floor.  Started on Lovenox 30mg  q 12 hrs. Foot pumps applied bilaterally at 80 mm. Heels elevated on bed with rolled towels. No evidence of DVT. Negative Homan. Physical therapy started on day #1 for gait training and transfer. OT started day #1 for ADL and assisted devices.  Patient's foley was d/c on day #1. Patient's IV  was d/c on day #2.  On post op day #3 patient was stable and ready for discharge to rehab facility.  Implants: Medacta GMK sphere 4+ femur 4 tibia 12 mm insert and to patella  She was given perioperative antibiotics:  Anti-infectives    Start     Dose/Rate Route Frequency Ordered Stop   08/24/15 1330  ceFAZolin (ANCEF) IVPB 2 g/50 mL premix     2 g 100 mL/hr over 30 Minutes Intravenous Every 6 hours 08/24/15 1147 08/24/15 2046   08/24/15 0632  ceFAZolin (ANCEF) 2-3 GM-% IVPB SOLR    Comments:  Rexanne Mano: cabinet override      08/24/15 0632 08/24/15 1844   08/24/15 0545  ceFAZolin (ANCEF) IVPB 2 g/50 mL premix      2 g 100 mL/hr over 30 Minutes Intravenous  Once 08/24/15 0544 08/24/15 0747    .  She was given sequential compression devices, early ambulation, and lovenox for DVT prophylaxis.  She benefited maximally from the hospital stay and there were no complications.    Recent vital signs:  Filed Vitals:   08/25/15 0605 08/25/15 0744  BP: 138/62 164/86  Pulse:  78  Temp:  98.7 F (37.1 C)  Resp:  18    Recent laboratory studies:  Lab Results  Component Value Date   HGB 10.5* 08/25/2015   HGB 11.1* 08/24/2015   HGB 11.5* 08/08/2015   Lab Results  Component Value Date   WBC 10.9 08/25/2015   PLT 305 08/25/2015   Lab Results  Component Value Date   INR 0.99 08/08/2015   Lab Results  Component Value Date   NA 129* 08/25/2015   K 4.2 08/25/2015   CL 98* 08/25/2015   CO2 22 08/25/2015   BUN 18 08/25/2015   CREATININE 1.01* 08/25/2015   GLUCOSE 114* 08/25/2015    Discharge Medications:     Medication List    ASK your doctor about these medications        cyclobenzaprine 5 MG tablet  Commonly known as:  FLEXERIL  Take 5 mg by mouth 2 (two) times daily as needed for muscle spasms.     lisinopril-hydrochlorothiazide 20-25 MG tablet  Commonly known  as:  PRINZIDE,ZESTORETIC  Take 1 tablet by mouth every morning.     meloxicam 15 MG tablet  Commonly known as:  MOBIC  Take 1 tablet (15 mg total) by mouth daily.     omeprazole 40 MG capsule  Commonly known as:  PRILOSEC  Take 40 mg by mouth daily.     oxyCODONE 5 MG immediate release tablet  Commonly known as:  ROXICODONE  Take 1 tablet (5 mg total) by mouth every 8 (eight) hours as needed.        Diagnostic Studies: Dg Knee 1-2 Views Right  08/24/2015  CLINICAL DATA:  Status post total knee replacement EXAM: RIGHT KNEE - 1-2 VIEW COMPARISON:  Right knee radiographs November 23, 2012; right knee CT July 11, 2015 FINDINGS: Frontal and lateral views were obtained. The patient is status post total knee  replacement with femoral and tibial prosthetic components appearing well-seated. No acute fracture or dislocation. There is soft tissue swelling and air within the joint, findings consistent with recent surgery. IMPRESSION: Prosthetic components appear well seated. No acute fracture or dislocation. Electronically Signed   By: Lowella Grip III M.D.   On: 08/24/2015 10:53    Disposition: 01-Home or Self Care        Follow-up Information    Follow up with [facility] .   Specialty:  [place] information:   [address] [place] [phone]       Signed: Raquel James PA-C 08/25/2015, 12:18 PM

## 2015-08-25 NOTE — Progress Notes (Signed)
Pt. Refused MOM last night and this morning.

## 2015-08-25 NOTE — Progress Notes (Addendum)
Patient asking multiple times this AM if she can remove her AVI as well as her Bone foam. She is also asking for"something" to placed under her knee. Patient is lying in bed with her right leg externally rotated and bent this AM. Explained to her the importance of keeping her leg straight and the consequences of not complying educated her on DVT prophylaxis as well. Bone foam placed will continue to monitor.

## 2015-08-25 NOTE — Progress Notes (Signed)
Plan is for patient to D/C Sunday 08/27/15 to [facility]. Per [facility] liaison patient is going to room 708. RN will call report to [place]. Clinical Education officer, museum (CSW) sent [facility] D/C Summary, FL2 and D/C packet via [facility]. CSW can fax updated D/C Summary to [phone]. Patient is aware of above. CSW will continue to follow and assist as needed.   Blima Rich, LCSW [phone]

## 2015-08-26 LAB — BASIC METABOLIC PANEL
ANION GAP: 7 (ref 5–15)
BUN: 18 mg/dL (ref 6–20)
CALCIUM: 8.8 mg/dL — AB (ref 8.9–10.3)
CO2: 24 mmol/L (ref 22–32)
Chloride: 101 mmol/L (ref 101–111)
Creatinine, Ser: 1.09 mg/dL — ABNORMAL HIGH (ref 0.44–1.00)
GFR, EST NON AFRICAN AMERICAN: 52 mL/min — AB (ref >60)
Glucose, Bld: 94 mg/dL (ref 65–99)
Potassium: 3.8 mmol/L (ref 3.5–5.1)
SODIUM: 132 mmol/L — AB (ref 135–145)

## 2015-08-26 LAB — CBC
HEMATOCRIT: 30.3 % — AB (ref 35.0–47.0)
HEMOGLOBIN: 10 g/dL — AB (ref 12.0–16.0)
MCH: 26.5 pg (ref 26.0–34.0)
MCHC: 33.1 g/dL (ref 32.0–36.0)
MCV: 80.1 fL (ref 80.0–100.0)
Platelets: 275 10*3/uL (ref 150–440)
RBC: 3.78 MIL/uL — AB (ref 3.80–5.20)
RDW: 15.2 % — ABNORMAL HIGH (ref 11.5–14.5)
WBC: 11.7 10*3/uL — AB (ref 3.6–11.0)

## 2015-08-26 MED ORDER — BISACODYL 10 MG RE SUPP: 10.0000 mg | Freq: Every day | RECTAL | Status: DC | PRN

## 2015-08-26 MED ORDER — DIPHENHYDRAMINE HCL 12.5 MG/5ML PO ELIX
12.5000 mg | ORAL_SOLUTION | Freq: Four times a day (QID) | ORAL | Status: DC | PRN
  Administered 2015-08-27: 12.5 mg via ORAL
  Filled 2015-08-26: qty 30
  Filled 2015-08-26: qty 50
  Filled 2015-08-26: qty 5

## 2015-08-26 MED ORDER — INFLUENZA VAC SPLIT QUAD 0.5 ML IM SUSY: 0.5000 mL | PREFILLED_SYRINGE | INTRAMUSCULAR | Status: DC

## 2015-08-26 MED ORDER — BISACODYL 10 MG RE SUPP
10.0000 mg | Freq: Once | RECTAL | Status: AC
  Administered 2015-08-26: 10 mg via RECTAL
  Filled 2015-08-26: qty 1

## 2015-08-26 MED ORDER — PNEUMOCOCCAL VAC POLYVALENT 25 MCG/0.5ML IJ INJ
0.5000 mL | INJECTION | INTRAMUSCULAR | Status: AC
  Administered 2015-08-27: 0.5 mL via INTRAMUSCULAR
  Filled 2015-08-26: qty 0.5

## 2015-08-26 NOTE — Progress Notes (Signed)
Physical Therapy Treatment Patient Details Name: Katherine Hartman MRN: [id number] DOB: [birth year] Today's Date: 08/26/2015    History of Present Illness Pt underwent R TKR without reported post-op complications. RN called because pt uncomfortable in bed. Pt has not had full return of sensation but is adamant about getting out of bed so PT evaluation performed earlier than what would be ideal. Pt still reports some numbness/tingling in R lower leg and foot.     PT Comments    Pt continues to be very pain limited and though her actual gait was better today she was not able to increase distance because of pain.  She does achieve >80 degrees of flexion (still quite limited with extension) but is screaming out in pain with any R LE movement.  Pt making very slow improvements.  She was more awake and alert this session, but pain continued to be a huge limiter.   Follow Up Recommendations  SNF     Equipment Recommendations  Rolling walker with 5" wheels    Recommendations for Other Services       Precautions / Restrictions Precautions Required Braces or Orthoses: Knee Immobilizer - Right Restrictions RLE Weight Bearing: Weight bearing as tolerated    Mobility  Bed Mobility Overal bed mobility: Needs Assistance Bed Mobility: Supine to Sit     Supine to sit: Mod assist     General bed mobility comments: Pt needs assist getting R LE off EOB and slowly controlling it to the floor secondary to hypersensitive pain response  Transfers Overall transfer level: Needs assistance Equipment used: Rolling walker (2 wheeled) Transfers: Sit to/from Stand Sit to Stand: Min assist         General transfer comment: Pt better able to assist getting to standing today (from raised bed) but continues to be pain limited   Ambulation/Gait Ambulation/Gait assistance: Mod assist;Min assist Ambulation Distance (Feet): 15 Feet Assistive device: Rolling walker (2 wheeled)       General Gait  Details: Pt able to take decent steps, but is so pain limited that after jst 5 ft she requests to stop, with much encouragement and motivation she is able to take a few more steps but ultimately her pain (and some c/o lightheadedness) limited her signficantly.   Stairs            Wheelchair Mobility    Modified Rankin (Stroke Patients Only)       Balance                                    Cognition Arousal/Alertness: Awake/alert Behavior During Therapy: Restless;Anxious Overall Cognitive Status: Within Functional Limits for tasks assessed                      Exercises Total Joint Exercises Ankle Circles/Pumps: Strengthening;Both;10 reps;Supine Quad Sets: Strengthening;Right;15 reps Gluteal Sets: AAROM;Right;15 reps Short Arc Quad: AAROM;10 reps;Right;Supine Heel Slides: AAROM;5 reps;Right Hip ABduction/ADduction: AAROM;AROM;10 reps Knee Flexion: 5 reps;PROM Goniometric ROM: 8-83    General Comments        Pertinent Vitals/Pain Pain Score: 10-Worst pain ever    Home Living                      Prior Function            PT Goals (current goals can now be found in the care plan section) Progress towards PT  goals: Progressing toward goals    Frequency  BID    PT Plan Current plan remains appropriate    Co-evaluation             End of Session Equipment Utilized During Treatment: Gait belt Activity Tolerance: Patient limited by pain Patient left: with call bell/phone within reach;with bed alarm set     Time: [id number] PT Time Calculation (min) (ACUTE ONLY): 27 min  Charges:  $Gait Training: 8-22 mins $Therapeutic Exercise: 8-22 mins                    G Codes:     Wayne Both, PT, DPT [phone] Kreg Shropshire 08/26/2015, 12:43 PM

## 2015-08-26 NOTE — Progress Notes (Signed)
   Subjective: 2 Days Post-Op Procedure(s) (LRB): TOTAL KNEE ARTHROPLASTY (Right) Patient reports pain as moderate.   Patient is well, and has had no acute complaints or problems.  Denies any CP, SOB, ABD pain. We will continue therapy today.  Plan is to go Rehab Sunday.  Objective: Vital signs in last 24 hours: Temp:  [98.6 F (37 C)-99.6 F (37.6 C)] 98.8 F (37.1 C) (01/28 0812) Pulse Rate:  [84-98] 97 (01/28 0812) Resp:  [18] 18 (01/28 0812) BP: (109-149)/(55-76) 114/55 mmHg (01/28 0812) SpO2:  [94 %-100 %] 94 % (01/28 0812)  Intake/Output from previous day: 01/27 0701 - 01/28 0700 In: 2070 [P.O.:360; I.V.:1710] Out: 1450 [Urine:1450] Intake/Output this shift:     Recent Labs  08/24/15 1321 08/25/15 0421 08/26/15 0331  HGB 11.1* 10.5* 10.0*    Recent Labs  08/25/15 0421 08/26/15 0331  WBC 10.9 11.7*  RBC 3.94 3.78*  HCT 32.2* 30.3*  PLT 305 275    Recent Labs  08/25/15 0421 08/25/15 1843 08/26/15 0331  NA 129* 132* 132*  K 4.2  --  3.8  CL 98*  --  101  CO2 22  --  24  BUN 18  --  18  CREATININE 1.01*  --  1.09*  GLUCOSE 114*  --  94  CALCIUM 8.7*  --  8.8*   No results for input(s): LABPT, INR in the last 72 hours.  EXAM General - Patient is Alert, Appropriate and Oriented Extremity - Neurovascular intact Sensation intact distally Intact pulses distally Dorsiflexion/Plantar flexion intact  Knee in extension, tolerating bone foam well Dressing - dressing C/D/I and no drainage Motor Function - intact, moving foot and toes well on exam.   Past Medical History  Diagnosis Date  . Arthritis   . Hypertension   . PONV (postoperative nausea and vomiting)   . GERD (gastroesophageal reflux disease)     Assessment/Plan:   2 Days Post-Op Procedure(s) (LRB): TOTAL KNEE ARTHROPLASTY (Right) Active Problems:   Primary osteoarthritis of right knee   Hyponatremia   Estimated body mass index is 35.04 kg/(m^2) as calculated from the  following:   Height as of this encounter: 5' 8.5" (1.74 m).   Weight as of this encounter: 106.096 kg (233 lb 14.4 oz). Advance diet Up with therapy  Needs BM Recheck Na in the am. Na stable. Continue with fluid restrictions.    DVT Prophylaxis - Lovenox, Foot Pumps and TED hose Weight-Bearing as tolerated to right leg D/C O2 and Pulse OX and try on Room Air  T. Rachelle Hora, PA-C [place] 08/26/2015, 8:35 AM

## 2015-08-26 NOTE — Progress Notes (Signed)
Pt has taken off ted hose and bone foam pillow herself today. rn has r/i on complications of not using these devices

## 2015-08-26 NOTE — Progress Notes (Signed)
Physical Therapy Treatment Patient Details Name: Katherine Hartman MRN: [id number] DOB: 12/19/1950 Today's Date: 08/26/2015    History of Present Illness Pt underwent R TKR without reported post-op complications. RN called because pt uncomfortable in bed. Pt has not had full return of sensation but is adamant about getting out of bed so PT evaluation performed earlier than what would be ideal. Pt still reports some numbness/tingling in R lower leg and foot.     PT Comments    Pt is able to ambulate for more than a relatively small distance for the first time this session.  She needs constant encouragement and cuing, but ultimately is able to show good effort and improvement.  She is still very pain limited and fearful, but generally continues to show slow improvement.   Follow Up Recommendations  SNF     Equipment Recommendations  Rolling walker with 5" wheels    Recommendations for Other Services       Precautions / Restrictions Precautions Required Braces or Orthoses: Knee Immobilizer - Right Restrictions RLE Weight Bearing: Weight bearing as tolerated    Mobility  Bed Mobility Overal bed mobility: Needs Assistance Bed Mobility: Supine to Sit;Sit to Supine     Supine to sit: Min assist Sit to supine: Min assist   General bed mobility comments: Again pt needing assist with R LE, does not indicate as much pain this afternoon during transitions.  Transfers Overall transfer level: Needs assistance Equipment used: Rolling walker (2 wheeled) Transfers: Sit to/from Stand Sit to Stand: Min assist         General transfer comment: Cuing for hand placement and foot set up, pt able to rise from lower bed this afternoon.  Ambulation/Gait Ambulation/Gait assistance: Min assist Ambulation Distance (Feet): 35 Feet Assistive device: Rolling walker (2 wheeled)       General Gait Details: Pt with very slow, deliberate steps but was able to increase ambulation distance and be on  her feet for much longer than any previous effort despite her pain and fatigue.    Stairs            Wheelchair Mobility    Modified Rankin (Stroke Patients Only)       Balance                                    Cognition Arousal/Alertness: Awake/alert Behavior During Therapy: Restless;Anxious Overall Cognitive Status: Within Functional Limits for tasks assessed                      Exercises Total Joint Exercises Ankle Circles/Pumps: Strengthening;Both;10 reps;Supine Quad Sets: Strengthening;Right;15 reps Gluteal Sets: Right;15 reps;Strengthening Short Arc Quad: AAROM;10 reps;Right;Supine Heel Slides: AAROM;5 reps;Right Hip ABduction/ADduction: AAROM;AROM;10 reps Knee Flexion: 5 reps;PROM    General Comments        Pertinent Vitals/Pain Pain Score: 10-Worst pain ever    Home Living                      Prior Function            PT Goals (current goals can now be found in the care plan section) Progress towards PT goals: Progressing toward goals    Frequency  BID    PT Plan Current plan remains appropriate    Co-evaluation             End of Session Equipment  Utilized During Treatment: Gait belt Activity Tolerance: Patient limited by pain Patient left: with call bell/phone within reach;with bed alarm set     Time: [id number] PT Time Calculation (min) (ACUTE ONLY): 46 min  Charges:  $Gait Training: 8-22 mins $Therapeutic Exercise: 8-22 mins $Therapeutic Activity: 8-22 mins                    G Codes:     Wayne Both, PT, DPT [phone]  Kreg Shropshire 08/26/2015, 5:41 PM

## 2015-08-27 DIAGNOSIS — E784 Other hyperlipidemia: Secondary | ICD-10-CM | POA: Diagnosis not present

## 2015-08-27 DIAGNOSIS — D649 Anemia, unspecified: Secondary | ICD-10-CM | POA: Diagnosis not present

## 2015-08-27 DIAGNOSIS — M6281 Muscle weakness (generalized): Secondary | ICD-10-CM | POA: Diagnosis not present

## 2015-08-27 DIAGNOSIS — H33311 Horseshoe tear of retina without detachment, right eye: Secondary | ICD-10-CM | POA: Diagnosis not present

## 2015-08-27 DIAGNOSIS — K210 Gastro-esophageal reflux disease with esophagitis: Secondary | ICD-10-CM | POA: Diagnosis not present

## 2015-08-27 DIAGNOSIS — M25569 Pain in unspecified knee: Secondary | ICD-10-CM | POA: Diagnosis not present

## 2015-08-27 DIAGNOSIS — Z471 Aftercare following joint replacement surgery: Secondary | ICD-10-CM | POA: Diagnosis not present

## 2015-08-27 DIAGNOSIS — K219 Gastro-esophageal reflux disease without esophagitis: Secondary | ICD-10-CM | POA: Diagnosis not present

## 2015-08-27 DIAGNOSIS — E871 Hypo-osmolality and hyponatremia: Secondary | ICD-10-CM | POA: Diagnosis not present

## 2015-08-27 DIAGNOSIS — Z96651 Presence of right artificial knee joint: Secondary | ICD-10-CM | POA: Diagnosis not present

## 2015-08-27 DIAGNOSIS — R600 Localized edema: Secondary | ICD-10-CM | POA: Diagnosis not present

## 2015-08-27 DIAGNOSIS — K5900 Constipation, unspecified: Secondary | ICD-10-CM | POA: Diagnosis not present

## 2015-08-27 DIAGNOSIS — Z5189 Encounter for other specified aftercare: Secondary | ICD-10-CM | POA: Diagnosis not present

## 2015-08-27 DIAGNOSIS — K649 Unspecified hemorrhoids: Secondary | ICD-10-CM | POA: Diagnosis not present

## 2015-08-27 DIAGNOSIS — S82009A Unspecified fracture of unspecified patella, initial encounter for closed fracture: Secondary | ICD-10-CM | POA: Diagnosis not present

## 2015-08-27 DIAGNOSIS — T148 Other injury of unspecified body region: Secondary | ICD-10-CM | POA: Diagnosis not present

## 2015-08-27 DIAGNOSIS — M1711 Unilateral primary osteoarthritis, right knee: Secondary | ICD-10-CM | POA: Diagnosis not present

## 2015-08-27 DIAGNOSIS — D519 Vitamin B12 deficiency anemia, unspecified: Secondary | ICD-10-CM | POA: Diagnosis not present

## 2015-08-27 DIAGNOSIS — Z23 Encounter for immunization: Secondary | ICD-10-CM | POA: Diagnosis not present

## 2015-08-27 DIAGNOSIS — I10 Essential (primary) hypertension: Secondary | ICD-10-CM | POA: Diagnosis not present

## 2015-08-27 DIAGNOSIS — M1990 Unspecified osteoarthritis, unspecified site: Secondary | ICD-10-CM | POA: Diagnosis not present

## 2015-08-27 LAB — BASIC METABOLIC PANEL
Anion gap: 6 (ref 5–15)
Anion gap: 7 (ref 5–15)
BUN: 37 mg/dL — AB (ref 6–20)
BUN: 37 mg/dL — AB (ref 6–20)
CALCIUM: 8.6 mg/dL — AB (ref 8.9–10.3)
CHLORIDE: 102 mmol/L (ref 101–111)
CO2: 24 mmol/L (ref 22–32)
CO2: 25 mmol/L (ref 22–32)
CREATININE: 2.21 mg/dL — AB (ref 0.44–1.00)
CREATININE: 2.07 mg/dL — AB (ref 0.44–1.00)
Calcium: 8.6 mg/dL — ABNORMAL LOW (ref 8.9–10.3)
Chloride: 100 mmol/L — ABNORMAL LOW (ref 101–111)
GFR calc Af Amer: 26 mL/min — ABNORMAL LOW (ref >60)
GFR calc Af Amer: 28 mL/min — ABNORMAL LOW (ref >60)
GFR calc non Af Amer: 22 mL/min — ABNORMAL LOW (ref >60)
GFR, EST NON AFRICAN AMERICAN: 24 mL/min — AB (ref >60)
GLUCOSE: 112 mg/dL — AB (ref 65–99)
GLUCOSE: 107 mg/dL — AB (ref 65–99)
POTASSIUM: 4 mmol/L (ref 3.5–5.1)
Potassium: 4.1 mmol/L (ref 3.5–5.1)
SODIUM: 132 mmol/L — AB (ref 135–145)
Sodium: 132 mmol/L — ABNORMAL LOW (ref 135–145)

## 2015-08-27 LAB — CBC
HEMATOCRIT: 30.7 % — AB (ref 35.0–47.0)
Hemoglobin: 9.9 g/dL — ABNORMAL LOW (ref 12.0–16.0)
MCH: 26.9 pg (ref 26.0–34.0)
MCHC: 32.4 g/dL (ref 32.0–36.0)
MCV: 83.2 fL (ref 80.0–100.0)
PLATELETS: 272 10*3/uL (ref 150–440)
RBC: 3.69 MIL/uL — ABNORMAL LOW (ref 3.80–5.20)
RDW: 15.5 % — AB (ref 11.5–14.5)
WBC: 12.2 10*3/uL — AB (ref 3.6–11.0)

## 2015-08-27 LAB — SODIUM: Sodium: 132 mmol/L — ABNORMAL LOW (ref 135–145)

## 2015-08-27 MED ORDER — OXYCODONE HCL 5 MG PO TABS: 5.0000 mg | ORAL_TABLET | ORAL | Status: DC | PRN

## 2015-08-27 MED ORDER — OXYCODONE HCL ER 15 MG PO T12A: 15.0000 mg | EXTENDED_RELEASE_TABLET | Freq: Two times a day (BID) | ORAL | Status: DC

## 2015-08-27 MED ORDER — ENOXAPARIN SODIUM 40 MG/0.4ML ~~LOC~~ SOLN: 40.0000 mg | SUBCUTANEOUS | Status: DC

## 2015-08-27 MED ORDER — SODIUM CHLORIDE 0.9 % IV BOLUS (SEPSIS)
1000.0000 mL | Freq: Once | INTRAVENOUS | Status: AC
  Administered 2015-08-27: 1000 mL via INTRAVENOUS

## 2015-08-27 NOTE — Clinical Social Work Note (Signed)
Pt is ready for discharge today to [facility]. Pt is aware and agreeable to discharge plan. Pt shared that her granddaughter is aware of pt's discharge as well. Facility has received discharge information and is ready to accept pt. RN will call report to [facility] at [phone] (Room#708). [facility] will provide transportation. CSW is signing off as no further needs identified.   Darden Dates, MSW, LCSW Clinical Social Worker  [phone]

## 2015-08-27 NOTE — Progress Notes (Signed)
Physical Therapy Treatment Patient Details Name: Katherine Hartman MRN: [id number] DOB: [birth year] Today's Date: 08/27/2015    History of Present Illness Pt underwent R TKR without reported post-op complications. RN called because pt uncomfortable in bed. Pt has not had full return of sensation but is adamant about getting out of bed so PT evaluation performed earlier than what would be ideal. Pt still reports some numbness/tingling in R lower leg and foot.     PT Comments    Pt is able to ambulate farther than any previous session and though she has a lot of pain and fatigue she is able to push through.  She continues to lack any quad control and needs KI during ambulation.  Pt continues to have extreme pain with PROM knee flexion but has been able to make slow but consistent gains.   Follow Up Recommendations  SNF     Equipment Recommendations  Rolling walker with 5" wheels    Recommendations for Other Services       Precautions / Restrictions Precautions Required Braces or Orthoses: Knee Immobilizer - Right Restrictions Weight Bearing Restrictions: No RLE Weight Bearing: Weight bearing as tolerated    Mobility  Bed Mobility Overal bed mobility: Needs Assistance Bed Mobility: Supine to Sit;Sit to Supine     Supine to sit: Min assist     General bed mobility comments: Pt in recliner on arrival  Transfers Overall transfer level: Modified independent Equipment used: Rolling walker (2 wheeled) Transfers: Sit to/from Stand Sit to Stand: Min guard         General transfer comment: Pt able to rise w/o physical assist, though she does continue to need a lot of cuing, set up and encouragement.   Ambulation/Gait Ambulation/Gait assistance: Min assist Ambulation Distance (Feet): 50 Feet Assistive device: Rolling walker (2 wheeled)       General Gait Details: Again pt is very slow and cautious with ambualtion (KI donned) but is able to push herself to do more with much  cuing.  Multiple times she states she can't do more than yesterday but with 2 standing rest breaks and much encouragement she is able to increased distance.    Stairs            Wheelchair Mobility    Modified Rankin (Stroke Patients Only)       Balance                                    Cognition Arousal/Alertness: Awake/alert Behavior During Therapy: Restless;Anxious Overall Cognitive Status: Within Functional Limits for tasks assessed                      Exercises Total Joint Exercises Ankle Circles/Pumps: Strengthening;Both;10 reps;Supine Quad Sets: Strengthening;Right;15 reps Gluteal Sets: Right;15 reps;Strengthening Short Arc Quad: AAROM;10 reps;Right;Supine Heel Slides: AAROM;5 reps;Right Hip ABduction/ADduction: AAROM;AROM;10 reps    General Comments        Pertinent Vitals/Pain Pain Score: 10-Worst pain ever    Home Living                      Prior Function            PT Goals (current goals can now be found in the care plan section) Progress towards PT goals: Progressing toward goals    Frequency  BID    PT Plan Current plan remains appropriate  Co-evaluation             End of Session Equipment Utilized During Treatment: Gait belt Activity Tolerance: Patient limited by pain Patient left: with call bell/phone within reach;with bed alarm set     Time: 0937-1006 PT Time Calculation (min) (ACUTE ONLY): 29 min  Charges:  $Gait Training: 8-22 mins $Therapeutic Exercise: 8-22 mins                    G Codes:     Wayne Both, PT, DPT [phone]  Kreg Shropshire 08/27/2015, 11:49 AM

## 2015-08-27 NOTE — Clinical Social Work Placement (Signed)
   CLINICAL SOCIAL WORK PLACEMENT  NOTE  Date:  08/27/2015  Patient Details  Name: [name] MRN: [id number] Date of Birth: 09/25/1950  Clinical Social Work is seeking post-discharge placement for this patient at the [place] level of care (*CSW will initial, date and re-position this form in  chart as items are completed):  Yes   Patient/family provided with [facility] Work Department's list of facilities offering this level of care within the geographic area requested by the patient (or if unable, by the patient's family).  Yes   Patient/family informed of their freedom to choose among providers that offer the needed level of care, that participate in Medicare, Medicaid or managed care program needed by the patient, have an available bed and are willing to accept the patient.  Yes   Patient/family informed of [facility]'s ownership interest in [facility] and [facility], as well as of the fact that they are under no obligation to receive care at these facilities.  PASRR submitted to EDS on 08/25/15     PASRR number received on 08/25/15     Existing PASRR number confirmed on       FL2 transmitted to all facilities in geographic area requested by pt/family on 08/25/15     FL2 transmitted to all facilities within larger geographic area on       Patient informed that his/her managed care company has contracts with or will negotiate with certain facilities, including the following:        Yes   Patient/family informed of bed offers received.  Patient chooses bed at  ([facility]     Physician recommends and patient chooses bed at  [facility])    Patient to be transferred to [facility] on 08/27/15.  Patient to be transferred to facility by [facility]     Patient family notified on 08/27/15 of transfer.  Name of family member notified:  Pt declined, she informed her granddaughter of discharge      PHYSICIAN        Additional Comment:    _______________________________________________ Darden Dates, LCSW 08/27/2015, 11:11 AM

## 2015-08-27 NOTE — Progress Notes (Signed)
   Subjective: 3 Days Post-Op Procedure(s) (LRB): TOTAL KNEE ARTHROPLASTY (Right) Patient reports pain as moderate.   Patient is well, but has had some minor complaints of bright red blood in stool. Patient with history of hemorroids..  Denies any CP, SOB, ABD pain. We will continue therapy today.  Plan is to go Rehab Sunday.  Objective: Vital signs in last 24 hours: Temp:  [98.4 F (36.9 C)-99.8 F (37.7 C)] 98.7 F (37.1 C) (01/29 0735) Pulse Rate:  [98-102] 98 (01/29 0735) Resp:  [16-18] 18 (01/29 0735) BP: (98-112)/(50-56) 105/52 mmHg (01/29 0735) SpO2:  [90 %-100 %] 95 % (01/29 0735)  Intake/Output from previous day: 01/28 0701 - 01/29 0700 In: 120 [P.O.:120] Out: 400 [Urine:400] Intake/Output this shift:     Recent Labs  08/24/15 1321 08/25/15 0421 08/26/15 0331 08/27/15 0743  HGB 11.1* 10.5* 10.0* 9.9*    Recent Labs  08/26/15 0331 08/27/15 0743  WBC 11.7* 12.2*  RBC 3.78* 3.69*  HCT 30.3* 30.7*  PLT 275 272    Recent Labs  08/25/15 0421  08/26/15 0331 08/27/15 0328  NA 129*  < > 132* 132*  K 4.2  --  3.8  --   CL 98*  --  101  --   CO2 22  --  24  --   BUN 18  --  18  --   CREATININE 1.01*  --  1.09*  --   GLUCOSE 114*  --  94  --   CALCIUM 8.7*  --  8.8*  --   < > = values in this interval not displayed. No results for input(s): LABPT, INR in the last 72 hours.  EXAM General - Patient is Alert, Appropriate and Oriented Extremity - Neurovascular intact Sensation intact distally Intact pulses distally Dorsiflexion/Plantar flexion intact  Knee in extension, tolerating bone foam well Dressing - dressing C/D/I and no drainage Motor Function - intact, moving foot and toes well on exam.   Past Medical History  Diagnosis Date  . Arthritis   . Hypertension   . PONV (postoperative nausea and vomiting)   . GERD (gastroesophageal reflux disease)     Assessment/Plan:   3 Days Post-Op Procedure(s) (LRB): TOTAL KNEE ARTHROPLASTY  (Right) Active Problems:   Primary osteoarthritis of right knee   Hyponatremia   Estimated body mass index is 35.04 kg/(m^2) as calculated from the following:   Height as of this encounter: 5' 8.5" (1.74 m).   Weight as of this encounter: 106.096 kg (233 lb 14.4 oz). Advance diet Up with therapy  Discharge to rehab today. Follow up with [facility] in 2 weeks Follow up with PCP, patient 65 yo with out colonoscopy. Bright red blood during BM yesterday while straining. Patient with history of hemorrhoids.   DVT Prophylaxis - Lovenox, Foot Pumps and TED hose Weight-Bearing as tolerated to right leg D/C O2 and Pulse OX and try on Room Air  T. Rachelle Hora, PA-C [place] 08/27/2015, 8:49 AM

## 2015-08-27 NOTE — Progress Notes (Signed)
Pt d/c to [facility] via ems. I/s to use incentive qhr

## 2015-08-27 NOTE — Discharge Instructions (Signed)

## 2015-08-27 NOTE — Progress Notes (Signed)
Creatinine slightly improved./ called to [facility],md suspended bp meds for the facility. md orders repeat bun,creatinine on Tuesday 08/29/15 for the facility. Okay for discharge. Report called to jill at the facility. I/s facility to try to limit pain med and have pt drink more fluids

## 2015-08-27 NOTE — Progress Notes (Addendum)
Pt with tachycardia and low bp. creatiinine elevated .lethargy noted. rn spokewith dr Marry Guan . md orders 1 bag NS OVER 3 HRS.OXYCONTIN NOT GIVEN

## 2015-08-28 LAB — SURGICAL PATHOLOGY

## 2015-08-30 DIAGNOSIS — K219 Gastro-esophageal reflux disease without esophagitis: Secondary | ICD-10-CM | POA: Diagnosis not present

## 2015-08-30 DIAGNOSIS — E784 Other hyperlipidemia: Secondary | ICD-10-CM | POA: Diagnosis not present

## 2015-08-30 DIAGNOSIS — I10 Essential (primary) hypertension: Secondary | ICD-10-CM | POA: Diagnosis not present

## 2015-08-30 DIAGNOSIS — Z96651 Presence of right artificial knee joint: Secondary | ICD-10-CM | POA: Diagnosis not present

## 2015-09-06 DIAGNOSIS — K5900 Constipation, unspecified: Secondary | ICD-10-CM | POA: Diagnosis not present

## 2015-09-06 DIAGNOSIS — K219 Gastro-esophageal reflux disease without esophagitis: Secondary | ICD-10-CM | POA: Diagnosis not present

## 2015-09-06 DIAGNOSIS — M25569 Pain in unspecified knee: Secondary | ICD-10-CM | POA: Diagnosis not present

## 2015-09-06 DIAGNOSIS — I10 Essential (primary) hypertension: Secondary | ICD-10-CM | POA: Diagnosis not present

## 2015-09-15 DIAGNOSIS — E784 Other hyperlipidemia: Secondary | ICD-10-CM | POA: Diagnosis not present

## 2015-09-15 DIAGNOSIS — R600 Localized edema: Secondary | ICD-10-CM | POA: Diagnosis not present

## 2015-09-15 DIAGNOSIS — I10 Essential (primary) hypertension: Secondary | ICD-10-CM | POA: Diagnosis not present

## 2015-09-15 DIAGNOSIS — K219 Gastro-esophageal reflux disease without esophagitis: Secondary | ICD-10-CM | POA: Diagnosis not present

## 2015-09-15 DIAGNOSIS — K5900 Constipation, unspecified: Secondary | ICD-10-CM | POA: Diagnosis not present

## 2015-09-15 DIAGNOSIS — S82009A Unspecified fracture of unspecified patella, initial encounter for closed fracture: Secondary | ICD-10-CM | POA: Diagnosis not present

## 2015-09-16 ENCOUNTER — Other Ambulatory Visit
Admission: RE | Admit: 2015-09-16 | Discharge: 2015-09-16 | Disposition: A | Discharge status: Home / Self Care | Payer: Medicare (Managed Care) | Source: Ambulatory Visit | Attending: Family Medicine | Admitting: Family Medicine

## 2015-09-16 DIAGNOSIS — M1990 Unspecified osteoarthritis, unspecified site: Secondary | ICD-10-CM | POA: Insufficient documentation

## 2015-09-16 DIAGNOSIS — D649 Anemia, unspecified: Secondary | ICD-10-CM | POA: Insufficient documentation

## 2015-09-16 DIAGNOSIS — I10 Essential (primary) hypertension: Secondary | ICD-10-CM | POA: Insufficient documentation

## 2015-09-16 LAB — CBC WITH DIFFERENTIAL/PLATELET
Basophils Absolute: 0 10*3/uL (ref 0–0.1)
Basophils Relative: 0 %
EOS ABS: 0.4 10*3/uL (ref 0–0.7)
EOS PCT: 6 %
HCT: 29.8 % — ABNORMAL LOW (ref 35.0–47.0)
HEMOGLOBIN: 9.6 g/dL — AB (ref 12.0–16.0)
LYMPHS ABS: 2.7 10*3/uL (ref 1.0–3.6)
Lymphocytes Relative: 37 %
MCH: 26.6 pg (ref 26.0–34.0)
MCHC: 32.3 g/dL (ref 32.0–36.0)
MCV: 82.4 fL (ref 80.0–100.0)
MONO ABS: 0.5 10*3/uL (ref 0.2–0.9)
MONOS PCT: 7 %
Neutro Abs: 3.6 10*3/uL (ref 1.4–6.5)
Neutrophils Relative %: 50 %
Platelets: 355 10*3/uL (ref 150–440)
RBC: 3.61 MIL/uL — ABNORMAL LOW (ref 3.80–5.20)
RDW: 15.7 % — AB (ref 11.5–14.5)
WBC: 7.3 10*3/uL (ref 3.6–11.0)

## 2015-09-16 LAB — COMPREHENSIVE METABOLIC PANEL
ALBUMIN: 2.8 g/dL — AB (ref 3.5–5.0)
ALT: 17 U/L (ref 14–54)
AST: 18 U/L (ref 15–41)
Alkaline Phosphatase: 71 U/L (ref 38–126)
Anion gap: 6 (ref 5–15)
BUN: 24 mg/dL — AB (ref 6–20)
CHLORIDE: 105 mmol/L (ref 101–111)
CO2: 27 mmol/L (ref 22–32)
Calcium: 9.2 mg/dL (ref 8.9–10.3)
Creatinine, Ser: 1.1 mg/dL — ABNORMAL HIGH (ref 0.44–1.00)
GFR calc Af Amer: 60 mL/min — ABNORMAL LOW (ref >60)
GFR, EST NON AFRICAN AMERICAN: 52 mL/min — AB (ref >60)
Glucose, Bld: 88 mg/dL (ref 65–99)
POTASSIUM: 4.7 mmol/L (ref 3.5–5.1)
SODIUM: 138 mmol/L (ref 135–145)
Total Bilirubin: 0.3 mg/dL (ref 0.3–1.2)
Total Protein: 6.1 g/dL — ABNORMAL LOW (ref 6.5–8.1)

## 2015-09-16 LAB — SEDIMENTATION RATE: Sed Rate: 49 mm/hr — ABNORMAL HIGH (ref 0–30)

## 2015-09-16 LAB — VITAMIN B12: VITAMIN B 12: 576 pg/mL (ref 180–914)

## 2015-09-25 DIAGNOSIS — H33311 Horseshoe tear of retina without detachment, right eye: Secondary | ICD-10-CM | POA: Diagnosis not present

## 2015-09-26 DIAGNOSIS — D519 Vitamin B12 deficiency anemia, unspecified: Secondary | ICD-10-CM | POA: Diagnosis not present

## 2015-09-26 DIAGNOSIS — K5900 Constipation, unspecified: Secondary | ICD-10-CM | POA: Diagnosis not present

## 2015-09-26 DIAGNOSIS — M25569 Pain in unspecified knee: Secondary | ICD-10-CM | POA: Diagnosis not present

## 2015-09-26 DIAGNOSIS — I10 Essential (primary) hypertension: Secondary | ICD-10-CM | POA: Diagnosis not present

## 2015-09-26 DIAGNOSIS — R600 Localized edema: Secondary | ICD-10-CM | POA: Diagnosis not present

## 2015-09-29 DIAGNOSIS — Z96651 Presence of right artificial knee joint: Secondary | ICD-10-CM | POA: Diagnosis not present

## 2015-10-03 ENCOUNTER — Other Ambulatory Visit: Payer: Self-pay | Admitting: Orthopedic Surgery

## 2015-10-03 DIAGNOSIS — M79604 Pain in right leg: Secondary | ICD-10-CM

## 2015-10-06 ENCOUNTER — Ambulatory Visit
Admission: RE | Admit: 2015-10-06 | Discharge: 2015-10-06 | Disposition: A | Discharge status: Home / Self Care | Payer: Medicare Other | Source: Ambulatory Visit | Attending: Orthopedic Surgery | Admitting: Orthopedic Surgery

## 2015-10-06 DIAGNOSIS — Z96651 Presence of right artificial knee joint: Secondary | ICD-10-CM | POA: Diagnosis not present

## 2015-10-06 DIAGNOSIS — M7989 Other specified soft tissue disorders: Secondary | ICD-10-CM | POA: Diagnosis not present

## 2015-10-06 DIAGNOSIS — M79604 Pain in right leg: Secondary | ICD-10-CM | POA: Diagnosis not present

## 2015-10-12 ENCOUNTER — Encounter: Payer: Self-pay | Admitting: Emergency Medicine

## 2015-10-12 ENCOUNTER — Emergency Department
Admission: EM | Admit: 2015-10-12 | Discharge: 2015-10-12 | Disposition: A | Discharge status: Home / Self Care | Payer: Medicare Other | Attending: Emergency Medicine | Admitting: Emergency Medicine

## 2015-10-12 ENCOUNTER — Emergency Department: Payer: Medicare Other

## 2015-10-12 DIAGNOSIS — Z7901 Long term (current) use of anticoagulants: Secondary | ICD-10-CM | POA: Insufficient documentation

## 2015-10-12 DIAGNOSIS — S8001XA Contusion of right knee, initial encounter: Secondary | ICD-10-CM | POA: Diagnosis not present

## 2015-10-12 DIAGNOSIS — Z791 Long term (current) use of non-steroidal anti-inflammatories (NSAID): Secondary | ICD-10-CM | POA: Diagnosis not present

## 2015-10-12 DIAGNOSIS — Z79899 Other long term (current) drug therapy: Secondary | ICD-10-CM | POA: Diagnosis not present

## 2015-10-12 DIAGNOSIS — Y998 Other external cause status: Secondary | ICD-10-CM | POA: Insufficient documentation

## 2015-10-12 DIAGNOSIS — W1839XA Other fall on same level, initial encounter: Secondary | ICD-10-CM | POA: Diagnosis not present

## 2015-10-12 DIAGNOSIS — S8002XA Contusion of left knee, initial encounter: Secondary | ICD-10-CM | POA: Diagnosis not present

## 2015-10-12 DIAGNOSIS — I10 Essential (primary) hypertension: Secondary | ICD-10-CM | POA: Insufficient documentation

## 2015-10-12 DIAGNOSIS — Z471 Aftercare following joint replacement surgery: Secondary | ICD-10-CM | POA: Diagnosis not present

## 2015-10-12 DIAGNOSIS — S8992XA Unspecified injury of left lower leg, initial encounter: Secondary | ICD-10-CM | POA: Diagnosis present

## 2015-10-12 DIAGNOSIS — F17210 Nicotine dependence, cigarettes, uncomplicated: Secondary | ICD-10-CM | POA: Insufficient documentation

## 2015-10-12 DIAGNOSIS — Y9389 Activity, other specified: Secondary | ICD-10-CM | POA: Diagnosis not present

## 2015-10-12 DIAGNOSIS — Y9289 Other specified places as the place of occurrence of the external cause: Secondary | ICD-10-CM | POA: Insufficient documentation

## 2015-10-12 DIAGNOSIS — M25562 Pain in left knee: Secondary | ICD-10-CM | POA: Diagnosis not present

## 2015-10-12 DIAGNOSIS — Z96651 Presence of right artificial knee joint: Secondary | ICD-10-CM | POA: Insufficient documentation

## 2015-10-12 MED ORDER — OXYCODONE HCL 5 MG PO TABS
ORAL_TABLET | ORAL | Status: AC
  Administered 2015-10-12: 10 mg via ORAL
  Filled 2015-10-12: qty 2

## 2015-10-12 MED ORDER — OXYCODONE HCL 5 MG PO TABS
10.0000 mg | ORAL_TABLET | Freq: Once | ORAL | Status: AC
  Administered 2015-10-12: 10 mg via ORAL

## 2015-10-12 MED ORDER — OXYCODONE-ACETAMINOPHEN 5-325 MG PO TABS
2.0000 | ORAL_TABLET | Freq: Once | ORAL | Status: DC
  Filled 2015-10-12: qty 2

## 2015-10-12 MED ORDER — OXYCODONE-ACETAMINOPHEN 5-325 MG PO TABS: 1.0000 | ORAL_TABLET | Freq: Four times a day (QID) | ORAL | Status: DC | PRN

## 2015-10-12 MED ORDER — CYCLOBENZAPRINE HCL 10 MG PO TABS: 10.0000 mg | ORAL_TABLET | Freq: Three times a day (TID) | ORAL | Status: DC | PRN

## 2015-10-12 MED ORDER — OXYCODONE HCL 5 MG PO TABS: 5.0000 mg | ORAL_TABLET | ORAL | Status: DC | PRN

## 2015-10-12 NOTE — ED Notes (Signed)
Pt reports mechanical fall today; reports bilateral knee pain. Reports replacement on 08/24/15 of the right knee.

## 2015-10-12 NOTE — Discharge Instructions (Signed)
Continue pain medicine as directed. Follow-up with your orthopedist if not improving or discuss pain control with your primary care physician.

## 2015-10-12 NOTE — ED Notes (Signed)
Patient with no complaints at this time. Respirations even and unlabored. Skin warm/dry. Discharge instructions reviewed with patient at this time. Patient given opportunity to voice concerns/ask questions. Patient discharged at this time and left Emergency Department with steady gait.   

## 2015-10-12 NOTE — ED Provider Notes (Signed)
[facility] Emergency Department Provider Note  ____________________________________________  Time seen: Approximately 7:46 PM  I have reviewed the triage vital signs and the nursing notes.   HISTORY  Chief Complaint Fall    HPI Katherine Hartman is a 65 y.o. female who fell onto her knees prior to arrival, when her left leg became weak. She has a history of right knee replacement in January 2017. She complains of right medial knee pain as well as left lateral knee pain. She is able to bear weight.   Past Medical History  Diagnosis Date  . Arthritis   . Hypertension   . PONV (postoperative nausea and vomiting)   . GERD (gastroesophageal reflux disease)     Patient Active Problem List   Diagnosis Date Noted  . Primary osteoarthritis of right knee 08/24/2015    Past Surgical History  Procedure Laterality Date  . Abdominal hysterectomy    . Tubal ligation    . Bladder suspension N/A   . Total knee arthroplasty Right 08/24/2015    Procedure: TOTAL KNEE ARTHROPLASTY;  Surgeon: Hessie Knows, MD;  Location: [facility] ORS;  Service: Orthopedics;  Laterality: Right;  . Joint replacement Right     knee    Current Outpatient Rx  Name  Route  Sig  Dispense  Refill  . cyclobenzaprine (FLEXERIL) 10 MG tablet   Oral   Take 1 tablet (10 mg total) by mouth every 8 (eight) hours as needed for muscle spasms.   21 tablet   0   . cyclobenzaprine (FLEXERIL) 10 MG tablet   Oral   Take 1 tablet (10 mg total) by mouth every 8 (eight) hours as needed for muscle spasms.   30 tablet   1   . cyclobenzaprine (FLEXERIL) 5 MG tablet   Oral   Take 5 mg by mouth 2 (two) times daily as needed for muscle spasms.         Marland Kitchen enoxaparin (LOVENOX) 40 MG/0.4ML injection   Subcutaneous   Inject 0.4 mLs (40 mg total) into the skin daily. X 14 days   14 Syringe   0   . lisinopril-hydrochlorothiazide (PRINZIDE,ZESTORETIC) 20-25 MG tablet   Oral   Take 1 tablet by mouth every  morning.         . meloxicam (MOBIC) 15 MG tablet   Oral   Take 1 tablet (15 mg total) by mouth daily.   30 tablet   0   . omeprazole (PRILOSEC) 40 MG capsule   Oral   Take 40 mg by mouth daily.         Marland Kitchen oxyCODONE (OXY IR/ROXICODONE) 5 MG immediate release tablet   Oral   Take 1-2 tablets (5-10 mg total) by mouth every 3 (three) hours as needed for breakthrough pain.   60 tablet   0   . oxyCODONE (OXYCONTIN) 15 mg 12 hr tablet   Oral   Take 1 tablet (15 mg total) by mouth every 12 (twelve) hours.   30 tablet   0   . oxyCODONE (ROXICODONE) 5 MG immediate release tablet   Oral   Take 1 tablet (5 mg total) by mouth every 4 (four) hours as needed.   20 tablet   0     Allergies Ibuprofen  Family History  Problem Relation Age of Onset  . Cancer Mother   . Hypertension Mother   . Diabetes Mother     Social History Social History  Substance Use Topics  . Smoking  status: Current Every Day Smoker -- 0.10 packs/day    Types: Cigarettes  . Smokeless tobacco: Current User  . Alcohol Use: No     Comment: stopped in Sept. 2016    Review of Systems Constitutional: No fever/chills Eyes: No visual changes. ENT: No sore throat. Cardiovascular: Denies chest pain. Respiratory: Denies shortness of breath. Gastrointestinal: No abdominal pain.  No nausea, no vomiting.  No diarrhea.  No constipation. Genitourinary: Negative for dysuria. Musculoskeletal: Negative for back pain. Skin: Negative for rash. Neurological: Negative for headaches, focal weakness or numbness. 10-point ROS otherwise negative.  ____________________________________________   PHYSICAL EXAM:  VITAL SIGNS: ED Triage Vitals  Enc Vitals Group     BP 10/12/15 1747 192/113 mmHg     Pulse Rate 10/12/15 1747 113     Resp 10/12/15 1747 16     Temp 10/12/15 1747 98.6 F (37 C)     Temp Source 10/12/15 1747 Oral     SpO2 10/12/15 1747 97 %     Weight 10/12/15 1747 226 lb (102.513 kg)     Height  10/12/15 1747 5\' 9"  (1.753 m)     Head Cir --      Peak Flow --      Pain Score 10/12/15 1746 10     Pain Loc --      Pain Edu? --      Excl. in [place]? --     Constitutional: Alert and oriented. Well appearing and in no acute distress. Eyes: Conjunctivae are normal. PERRL. EOMI. Ears:  Clear with normal landmarks. No erythema. Head: Atraumatic. Nose: No congestion/rhinnorhea. Mouth/Throat: Mucous membranes are moist.  Oropharynx non-erythematous. No lesions. Neck:  Supple.  No adenopathy.   Cardiovascular: Normal rate, regular rhythm. Grossly normal heart sounds.  Good peripheral circulation. Respiratory: Normal respiratory effort.  No retractions. Lungs CTAB. Gastrointestinal: Soft and nontender. No distention. No abdominal bruits. No CVA tenderness. Musculoskeletal: Right knee: Healed incision from total knee replacement. Tender over the patella and joint line. Range of motion intact. Left knee: No effusion. Tender over the patella, joint lines. No laxity on exam. Neurologic:  Normal speech and language. No gross focal neurologic deficits are appreciated. No gait instability. Skin:  Skin is warm, dry and intact. No rash noted. Psychiatric: Mood and affect are normal. Speech and behavior are normal.  ____________________________________________   LABS (all labs ordered are listed, but only abnormal results are displayed)  Labs Reviewed - No data to display ____________________________________________  EKG    ____________________________________________  RADIOLOGY  CLINICAL DATA: [place] and injured right knee.  EXAM: RIGHT KNEE - COMPLETE 4+ VIEW  COMPARISON: 07/11/2015  FINDINGS: The knee prosthesis is intact. No periprosthetic fracture. No joint effusion.  IMPRESSION: No acute bony findings. Intact total knee arthroplasty.   Electronically Signed  By: Marijo Sanes M.D.   CLINICAL DATA: Pain after fall. Knee gave out.  EXAM: LEFT KNEE - COMPLETE  4+ VIEW  COMPARISON: None.  FINDINGS: No fracture, dislocation, or joint effusion.  IMPRESSION: Negative.   Electronically Signed  By: Dorise Bullion III M.D  On: 10/12/2015 18:20   On: 10/12/2015 18:20  ____________________________________________   PROCEDURES  Procedure(s) performed: None  Critical Care performed: No  ____________________________________________   INITIAL IMPRESSION / ASSESSMENT AND PLAN / ED COURSE  Pertinent labs & imaging results that were available during my care of the patient were reviewed by me and considered in my medical decision making (see chart for details).  65 year old female with history of right  knee replacement January 2017, whose left leg became weak and she landed on both her knees. Stable x-rays today. Ambulating with minimal difficulty. Treated for contusions with oxycodone. Also given Flexeril. She can follow-up with her primary physician or her orthopedic surgeon Dr. Rudene Christians if not improving. ____________________________________________   FINAL CLINICAL IMPRESSION(S) / ED DIAGNOSES  Final diagnoses:  Knee contusion, left, initial encounter  Knee contusion, right, initial encounter      Mortimer Fries, PA-C 10/12/15 2019  Mortimer Fries, PA-C 10/12/15 2020  Orbie Pyo, MD 10/13/15 0006

## 2015-10-12 NOTE — ED Notes (Signed)
See triage note  States her left knee gave out today  [place]  Having pain to left knee . Also has had recent knee replacement to the right side   States when she fell she hit the lateral aspect of right knee

## 2015-10-18 DIAGNOSIS — M170 Bilateral primary osteoarthritis of knee: Secondary | ICD-10-CM | POA: Diagnosis not present

## 2015-10-18 DIAGNOSIS — G4700 Insomnia, unspecified: Secondary | ICD-10-CM | POA: Diagnosis not present

## 2015-10-18 DIAGNOSIS — D649 Anemia, unspecified: Secondary | ICD-10-CM | POA: Diagnosis not present

## 2015-10-18 DIAGNOSIS — I10 Essential (primary) hypertension: Secondary | ICD-10-CM | POA: Diagnosis not present

## 2015-10-25 DIAGNOSIS — M25562 Pain in left knee: Secondary | ICD-10-CM | POA: Diagnosis not present

## 2015-10-25 DIAGNOSIS — M1712 Unilateral primary osteoarthritis, left knee: Secondary | ICD-10-CM | POA: Diagnosis not present

## 2015-10-31 DIAGNOSIS — M170 Bilateral primary osteoarthritis of knee: Secondary | ICD-10-CM | POA: Diagnosis not present

## 2015-10-31 DIAGNOSIS — F17210 Nicotine dependence, cigarettes, uncomplicated: Secondary | ICD-10-CM | POA: Diagnosis not present

## 2015-10-31 DIAGNOSIS — I10 Essential (primary) hypertension: Secondary | ICD-10-CM | POA: Diagnosis not present

## 2015-10-31 DIAGNOSIS — G4700 Insomnia, unspecified: Secondary | ICD-10-CM | POA: Diagnosis not present

## 2015-10-31 DIAGNOSIS — Z9181 History of falling: Secondary | ICD-10-CM | POA: Diagnosis not present

## 2015-10-31 DIAGNOSIS — K760 Fatty (change of) liver, not elsewhere classified: Secondary | ICD-10-CM | POA: Diagnosis not present

## 2015-10-31 DIAGNOSIS — D649 Anemia, unspecified: Secondary | ICD-10-CM | POA: Diagnosis not present

## 2015-10-31 DIAGNOSIS — M5441 Lumbago with sciatica, right side: Secondary | ICD-10-CM | POA: Diagnosis not present

## 2015-11-01 ENCOUNTER — Other Ambulatory Visit: Payer: Self-pay | Admitting: Orthopedic Surgery

## 2015-11-01 DIAGNOSIS — M23352 Other meniscus derangements, posterior horn of lateral meniscus, left knee: Secondary | ICD-10-CM

## 2015-11-10 DIAGNOSIS — M5441 Lumbago with sciatica, right side: Secondary | ICD-10-CM | POA: Diagnosis not present

## 2015-11-10 DIAGNOSIS — D649 Anemia, unspecified: Secondary | ICD-10-CM | POA: Diagnosis not present

## 2015-11-10 DIAGNOSIS — G4700 Insomnia, unspecified: Secondary | ICD-10-CM | POA: Diagnosis not present

## 2015-11-10 DIAGNOSIS — M170 Bilateral primary osteoarthritis of knee: Secondary | ICD-10-CM | POA: Diagnosis not present

## 2015-11-10 DIAGNOSIS — I10 Essential (primary) hypertension: Secondary | ICD-10-CM | POA: Diagnosis not present

## 2015-11-10 DIAGNOSIS — K760 Fatty (change of) liver, not elsewhere classified: Secondary | ICD-10-CM | POA: Diagnosis not present

## 2015-11-14 ENCOUNTER — Encounter: Payer: Self-pay | Admitting: Emergency Medicine

## 2015-11-14 ENCOUNTER — Emergency Department
Admission: EM | Admit: 2015-11-14 | Discharge: 2015-11-14 | Discharge status: Against Medical Advice | Payer: Medicare Other | Attending: Emergency Medicine | Admitting: Emergency Medicine

## 2015-11-14 DIAGNOSIS — K219 Gastro-esophageal reflux disease without esophagitis: Secondary | ICD-10-CM | POA: Insufficient documentation

## 2015-11-14 DIAGNOSIS — M1711 Unilateral primary osteoarthritis, right knee: Secondary | ICD-10-CM | POA: Insufficient documentation

## 2015-11-14 DIAGNOSIS — R110 Nausea: Secondary | ICD-10-CM | POA: Insufficient documentation

## 2015-11-14 DIAGNOSIS — I10 Essential (primary) hypertension: Secondary | ICD-10-CM | POA: Diagnosis not present

## 2015-11-14 DIAGNOSIS — Z96651 Presence of right artificial knee joint: Secondary | ICD-10-CM | POA: Diagnosis not present

## 2015-11-14 DIAGNOSIS — F17210 Nicotine dependence, cigarettes, uncomplicated: Secondary | ICD-10-CM | POA: Diagnosis not present

## 2015-11-14 DIAGNOSIS — M25561 Pain in right knee: Secondary | ICD-10-CM | POA: Diagnosis present

## 2015-11-14 DIAGNOSIS — F1290 Cannabis use, unspecified, uncomplicated: Secondary | ICD-10-CM | POA: Insufficient documentation

## 2015-11-14 DIAGNOSIS — Z79899 Other long term (current) drug therapy: Secondary | ICD-10-CM | POA: Diagnosis not present

## 2015-11-14 DIAGNOSIS — Z7901 Long term (current) use of anticoagulants: Secondary | ICD-10-CM | POA: Diagnosis not present

## 2015-11-14 MED ORDER — ONDANSETRON 4 MG PO TBDP
4.0000 mg | ORAL_TABLET | Freq: Once | ORAL | Status: AC
  Administered 2015-11-14: 4 mg via ORAL
  Filled 2015-11-14: qty 1

## 2015-11-14 MED ORDER — MELOXICAM 15 MG PO TABS: 15.0000 mg | ORAL_TABLET | Freq: Every day | ORAL | Status: DC

## 2015-11-14 MED ORDER — OXYCODONE HCL 5 MG PO TABS
5.0000 mg | ORAL_TABLET | Freq: Once | ORAL | Status: AC
Start: 2015-11-14 — End: 2015-11-14
  Administered 2015-11-14: 5 mg via ORAL
  Filled 2015-11-14: qty 1

## 2015-11-14 MED ORDER — OXYCODONE HCL 5 MG PO TABS: 5.0000 mg | ORAL_TABLET | Freq: Four times a day (QID) | ORAL | Status: DC | PRN

## 2015-11-14 NOTE — ED Notes (Signed)
states she is having pain to knee having going back to work  ..thinks she has done to much

## 2015-11-14 NOTE — Discharge Instructions (Signed)

## 2015-11-14 NOTE — ED Provider Notes (Signed)
Apolonio Schneiders, attending physician, personally viewed and interpreted this EKG  EKG Time: 1531 Rate: 101 Rhythm: sinus tachycardia Axis: normal Intervals: qtc 542 QRS: nonspecific intraventricular block ST changes: no st elevation equivalent Impression: abnormal ekg   Nance Pear, MD 11/14/15 1606

## 2015-11-14 NOTE — ED Provider Notes (Signed)
[facility] Emergency Department Provider Note  ____________________________________________  Time seen: Approximately 3:05 PM  I have reviewed the triage vital signs and the nursing notes.   HISTORY  Chief Complaint Knee Pain    HPI Katherine Hartman is a 65 y.o. female , NAD, presents to the emergency room with 3 day history of right knee pain. States she has off and on right knee pain since right knee total replacement completed in January 2017.  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 Has had increasing pain in the right knee over the last couple of days. Has been somewhat swollen and warm to the touch but the patient notes this is stable for her. Has completed physical therapy as scheduled by her orthopedic surgeon. States that she cannot take Tylenol and is allergic to ibuprofen. Does take Mobic on a daily basis but has run out of that medication as well and is requesting refill. States she normally takes oxycodone for her pain and would like a refill of such today. She states she tried to contact Dr. Rudene Christians' office but he is in surgery all day today. Has not tried to contact her primary care provider. Also notes she has some nausea which she associates with her hypertension. States hypertension has become uncontrolled and last few months and has recently restarted her antihypertensive medications. Does note she took her medications today. Denies any chest pain, headache, visual changes, numbness, weakness, tingling. Has not had any back pain or abdominal pain. No vomiting or diarrhea.   Past Medical History  Diagnosis Date  . Arthritis   . Hypertension   . PONV (postoperative nausea and vomiting)   . GERD (gastroesophageal reflux disease)     Patient Active Problem List   Diagnosis Date Noted  . Primary osteoarthritis of right knee 08/24/2015    Past Surgical History  Procedure Laterality Date   . Abdominal hysterectomy    . Tubal ligation    . Bladder suspension N/A   . Total knee arthroplasty Right 08/24/2015    Procedure: TOTAL KNEE ARTHROPLASTY;  Surgeon: Hessie Knows, MD;  Location: [facility] ORS;  Service: Orthopedics;  Laterality: Right;  . Joint replacement Right     knee    Current Outpatient Rx  Name  Route  Sig  Dispense  Refill  . cyclobenzaprine (FLEXERIL) 10 MG tablet   Oral   Take 1 tablet (10 mg total) by mouth every 8 (eight) hours as needed for muscle spasms.   21 tablet   0   . cyclobenzaprine (FLEXERIL) 10 MG tablet   Oral   Take 1 tablet (10 mg total) by mouth every 8 (eight) hours as needed for muscle spasms.   30 tablet   1   . cyclobenzaprine (FLEXERIL) 5 MG tablet   Oral   Take 5 mg by mouth 2 (two) times daily as needed for muscle spasms.         Marland Kitchen enoxaparin (LOVENOX) 40 MG/0.4ML injection   Subcutaneous   Inject 0.4 mLs (40 mg total) into the skin daily. X 14 days   14 Syringe   0   . lisinopril-hydrochlorothiazide (PRINZIDE,ZESTORETIC) 20-25 MG tablet   Oral   Take 1 tablet by mouth every morning.         . meloxicam (MOBIC) 15 MG tablet   Oral   Take 1 tablet (15 mg total) by mouth daily.  30 tablet   0   . omeprazole (PRILOSEC) 40 MG capsule   Oral   Take 40 mg by mouth daily.         Marland Kitchen oxyCODONE (ROXICODONE) 5 MG immediate release tablet   Oral   Take 1 tablet (5 mg total) by mouth every 6 (six) hours as needed for severe pain.   6 tablet   0     Allergies Ibuprofen  Family History  Problem Relation Age of Onset  . Cancer Mother   . Hypertension Mother   . Diabetes Mother     Social History Social History  Substance Use Topics  . Smoking status: Current Every Day Smoker -- 0.10 packs/day    Types: Cigarettes  . Smokeless tobacco: Current User  . Alcohol Use: No     Comment: stopped in Sept. 2016     Review of Systems  Constitutional: No fever/chills, fatigue Eyes: No visual changes.   Cardiovascular: No chest pain, palpitations. Respiratory: No cough. No shortness of breath. No wheezing.  Gastrointestinal: Positive nausea. No abdominal pain.  No vomiting.  No diarrhea.  No constipation. Genitourinary: Negative for dysuria. No hematuria. No urinary hesitancy, urgency or increased frequency. Musculoskeletal: Positive right knee pain. Negative for back pain.  Skin: Positive redness, warmth, swelling right knee. Negative for rash. Neurological: Negative for headaches, focal weakness or numbness. No tingling. 10-point ROS otherwise negative.  ____________________________________________   PHYSICAL EXAM:  VITAL SIGNS: ED Triage Vitals  Enc Vitals Group     BP 11/14/15 1441 191/103 mmHg     Pulse Rate 11/14/15 1441 106     Resp 11/14/15 1441 18     Temp 11/14/15 1441 98.5 F (36.9 C)     Temp Source 11/14/15 1441 Oral     SpO2 11/14/15 1441 97 %     Weight 11/14/15 1441 218 lb (98.884 kg)     Height 11/14/15 1441 5\' 9"  (1.753 m)     Head Cir --      Peak Flow --      Pain Score 11/14/15 1453 7     Pain Loc --      Pain Edu? --      Excl. in [place]? --      Constitutional: Alert and oriented. Well appearing and in no acute distress. Eyes: Conjunctivae are normal. PERRL.  Head: Atraumatic. Cardiovascular: Normal rate, regular rhythm. Normal S1 and S2.  Good peripheral circulation with 2+ pulses noted in the right lower extremity. Respiratory: Normal respiratory effort without tachypnea or retractions. Lungs CTAB. Musculoskeletal: Stable range of motion of the right knee per the patient. No laxity with anterior and posterior drawer. No lower extremity edema.  No joint effusions. Neurologic:  Normal speech and language. No gross focal neurologic deficits are appreciated.  Skin:  Vertical postsurgical scar noted about the right knee. Skin is slightly warm to touch about the anterior knee. Skin is warm, dry and intact. No rash noted. Psychiatric: Mood and affect are  normal. Speech and behavior are normal. Patient exhibits appropriate insight and judgement.   ____________________________________________   LABS  Declined by patient ____________________________________________  EKG  EKG revealed a sinus tachycardia with a ventricular rate of 101 bpm. There is notation of nonspecific intraventricular block but no evidence of ST elevation. No significant changes as compared to EKG on file from July 2016. EKG was also reviewed by Dr. Nance Pear. ____________________________________________  RADIOLOGY  Declined by patient ____________________________________________    PROCEDURES  Procedure(s)  performed: None      Medications  oxyCODONE (Oxy IR/ROXICODONE) immediate release tablet 5 mg (5 mg Oral Given 11/14/15 1536)  ondansetron (ZOFRAN-ODT) disintegrating tablet 4 mg (4 mg Oral Given 11/14/15 1536)     ____________________________________________   INITIAL IMPRESSION / ASSESSMENT AND PLAN / ED COURSE  Considering significantly elevated blood pressure, slightly tachycardic heart rate with abnormal EKG and presence of nausea with an unknown source, I spoke with the patient in regards to completing basic blood workup to rule out any cardiac abnormality. Patient declined all blood work as she states she will be seeing her primary care provider tomorrow going to lab for for a routine blood draw. She states she is feeling well and feels the abnormalities are due to her blood pressure being uncontrolled and her knee pain. Patient's presentation and evaluation was discussed with Dr. Nance Pear. Patient understands the possible life-threatening outcomes of potential cardiac abnormalities and verbalizes understanding of such. She will be discharged [place] per her request.  Patient's diagnosis is consistent with right knee pain, nausea, central hypertension. Patient will be discharged home with prescriptions for oxycodone and  meloxicam to take as directed. Patient is understanding that she will no longer receive any narcotic pain medications from this emergency department and should follow up with her orthopedic physician and for a primary care provider if she is in need of chronic pain medications.  Patient is given ED precautions to return to the ED for any worsening or new symptoms and verbalizes such.    ____________________________________________  FINAL CLINICAL IMPRESSION(S) / ED DIAGNOSES  Final diagnoses:  Right knee pain  Nausea  Essential hypertension      NEW MEDICATIONS STARTED DURING THIS VISIT:  New Prescriptions   MELOXICAM (MOBIC) 15 MG TABLET    Take 1 tablet (15 mg total) by mouth daily.   OXYCODONE (ROXICODONE) 5 MG IMMEDIATE RELEASE TABLET    Take 1 tablet (5 mg total) by mouth every 6 (six) hours as needed for severe pain.         Braxton Feathers, PA-C 11/14/15 [place], MD 11/14/15 [phone]

## 2015-11-16 DIAGNOSIS — M170 Bilateral primary osteoarthritis of knee: Secondary | ICD-10-CM | POA: Diagnosis not present

## 2015-11-16 DIAGNOSIS — K760 Fatty (change of) liver, not elsewhere classified: Secondary | ICD-10-CM | POA: Diagnosis not present

## 2015-11-16 DIAGNOSIS — G4700 Insomnia, unspecified: Secondary | ICD-10-CM | POA: Diagnosis not present

## 2015-11-16 DIAGNOSIS — I10 Essential (primary) hypertension: Secondary | ICD-10-CM | POA: Diagnosis not present

## 2015-11-16 DIAGNOSIS — M5441 Lumbago with sciatica, right side: Secondary | ICD-10-CM | POA: Diagnosis not present

## 2015-11-16 DIAGNOSIS — D649 Anemia, unspecified: Secondary | ICD-10-CM | POA: Diagnosis not present

## 2015-11-23 ENCOUNTER — Ambulatory Visit
Admission: RE | Admit: 2015-11-23 | Discharge: 2015-11-23 | Disposition: A | Discharge status: Home / Self Care | Payer: Medicare Other | Source: Ambulatory Visit | Attending: Orthopedic Surgery | Admitting: Orthopedic Surgery

## 2015-11-23 DIAGNOSIS — M7120 Synovial cyst of popliteal space [Baker], unspecified knee: Secondary | ICD-10-CM | POA: Insufficient documentation

## 2015-11-23 DIAGNOSIS — K760 Fatty (change of) liver, not elsewhere classified: Secondary | ICD-10-CM | POA: Diagnosis not present

## 2015-11-23 DIAGNOSIS — M23322 Other meniscus derangements, posterior horn of medial meniscus, left knee: Secondary | ICD-10-CM | POA: Insufficient documentation

## 2015-11-23 DIAGNOSIS — M170 Bilateral primary osteoarthritis of knee: Secondary | ICD-10-CM | POA: Diagnosis not present

## 2015-11-23 DIAGNOSIS — G4700 Insomnia, unspecified: Secondary | ICD-10-CM | POA: Diagnosis not present

## 2015-11-23 DIAGNOSIS — I10 Essential (primary) hypertension: Secondary | ICD-10-CM | POA: Diagnosis not present

## 2015-11-23 DIAGNOSIS — M1712 Unilateral primary osteoarthritis, left knee: Secondary | ICD-10-CM | POA: Insufficient documentation

## 2015-11-23 DIAGNOSIS — M23352 Other meniscus derangements, posterior horn of lateral meniscus, left knee: Secondary | ICD-10-CM | POA: Insufficient documentation

## 2015-11-23 DIAGNOSIS — M5441 Lumbago with sciatica, right side: Secondary | ICD-10-CM | POA: Diagnosis not present

## 2015-11-23 DIAGNOSIS — M23242 Derangement of anterior horn of lateral meniscus due to old tear or injury, left knee: Secondary | ICD-10-CM | POA: Insufficient documentation

## 2015-11-23 DIAGNOSIS — R1013 Epigastric pain: Secondary | ICD-10-CM | POA: Diagnosis not present

## 2015-11-23 DIAGNOSIS — D649 Anemia, unspecified: Secondary | ICD-10-CM | POA: Diagnosis not present

## 2015-11-23 DIAGNOSIS — M25562 Pain in left knee: Secondary | ICD-10-CM | POA: Diagnosis not present

## 2015-12-11 DIAGNOSIS — M1712 Unilateral primary osteoarthritis, left knee: Secondary | ICD-10-CM | POA: Diagnosis not present

## 2015-12-11 DIAGNOSIS — M1711 Unilateral primary osteoarthritis, right knee: Secondary | ICD-10-CM | POA: Diagnosis not present

## 2015-12-12 ENCOUNTER — Other Ambulatory Visit: Payer: Self-pay | Admitting: Orthopedic Surgery

## 2015-12-12 DIAGNOSIS — M5441 Lumbago with sciatica, right side: Secondary | ICD-10-CM | POA: Diagnosis not present

## 2015-12-12 DIAGNOSIS — K760 Fatty (change of) liver, not elsewhere classified: Secondary | ICD-10-CM | POA: Diagnosis not present

## 2015-12-12 DIAGNOSIS — G4700 Insomnia, unspecified: Secondary | ICD-10-CM | POA: Diagnosis not present

## 2015-12-12 DIAGNOSIS — M1712 Unilateral primary osteoarthritis, left knee: Secondary | ICD-10-CM

## 2015-12-12 DIAGNOSIS — I10 Essential (primary) hypertension: Secondary | ICD-10-CM | POA: Diagnosis not present

## 2015-12-12 DIAGNOSIS — M170 Bilateral primary osteoarthritis of knee: Secondary | ICD-10-CM | POA: Diagnosis not present

## 2015-12-12 DIAGNOSIS — D649 Anemia, unspecified: Secondary | ICD-10-CM | POA: Diagnosis not present

## 2015-12-20 ENCOUNTER — Ambulatory Visit: Payer: Medicare Other

## 2015-12-21 DIAGNOSIS — G4700 Insomnia, unspecified: Secondary | ICD-10-CM | POA: Diagnosis not present

## 2015-12-21 DIAGNOSIS — I10 Essential (primary) hypertension: Secondary | ICD-10-CM | POA: Diagnosis not present

## 2015-12-21 DIAGNOSIS — M130 Polyarthritis, unspecified: Secondary | ICD-10-CM | POA: Diagnosis not present

## 2015-12-21 DIAGNOSIS — M170 Bilateral primary osteoarthritis of knee: Secondary | ICD-10-CM | POA: Diagnosis not present

## 2015-12-22 DIAGNOSIS — D649 Anemia, unspecified: Secondary | ICD-10-CM | POA: Diagnosis not present

## 2015-12-22 DIAGNOSIS — G4700 Insomnia, unspecified: Secondary | ICD-10-CM | POA: Diagnosis not present

## 2015-12-22 DIAGNOSIS — M5441 Lumbago with sciatica, right side: Secondary | ICD-10-CM | POA: Diagnosis not present

## 2015-12-22 DIAGNOSIS — K760 Fatty (change of) liver, not elsewhere classified: Secondary | ICD-10-CM | POA: Diagnosis not present

## 2015-12-22 DIAGNOSIS — I10 Essential (primary) hypertension: Secondary | ICD-10-CM | POA: Diagnosis not present

## 2015-12-22 DIAGNOSIS — M170 Bilateral primary osteoarthritis of knee: Secondary | ICD-10-CM | POA: Diagnosis not present

## 2016-01-10 ENCOUNTER — Ambulatory Visit: Payer: Medicare Other | Attending: Orthopedic Surgery

## 2016-01-18 ENCOUNTER — Ambulatory Visit
Admission: RE | Admit: 2016-01-18 | Discharge: 2016-01-18 | Disposition: A | Discharge status: Home / Self Care | Payer: Medicare Other | Source: Ambulatory Visit | Attending: Orthopedic Surgery | Admitting: Orthopedic Surgery

## 2016-01-18 ENCOUNTER — Encounter: Payer: Self-pay | Admitting: Emergency Medicine

## 2016-01-18 ENCOUNTER — Ambulatory Visit
Admission: RE | Admit: 2016-01-18 | Discharge: 2016-01-18 | Disposition: A | Discharge status: Home / Self Care | Payer: Medicare Other | Source: Ambulatory Visit | Attending: Physician Assistant | Admitting: Physician Assistant

## 2016-01-18 ENCOUNTER — Other Ambulatory Visit: Payer: Self-pay | Admitting: Physician Assistant

## 2016-01-18 ENCOUNTER — Emergency Department
Admission: EM | Admit: 2016-01-18 | Discharge: 2016-01-18 | Disposition: A | Discharge status: Home / Self Care | Payer: Medicare Other | Attending: Emergency Medicine | Admitting: Emergency Medicine

## 2016-01-18 DIAGNOSIS — S8392XA Sprain of unspecified site of left knee, initial encounter: Secondary | ICD-10-CM | POA: Insufficient documentation

## 2016-01-18 DIAGNOSIS — Y929 Unspecified place or not applicable: Secondary | ICD-10-CM | POA: Insufficient documentation

## 2016-01-18 DIAGNOSIS — Y939 Activity, unspecified: Secondary | ICD-10-CM | POA: Insufficient documentation

## 2016-01-18 DIAGNOSIS — M7122 Synovial cyst of popliteal space [Baker], left knee: Secondary | ICD-10-CM | POA: Insufficient documentation

## 2016-01-18 DIAGNOSIS — Z79899 Other long term (current) drug therapy: Secondary | ICD-10-CM | POA: Diagnosis not present

## 2016-01-18 DIAGNOSIS — M25462 Effusion, left knee: Secondary | ICD-10-CM

## 2016-01-18 DIAGNOSIS — I10 Essential (primary) hypertension: Secondary | ICD-10-CM | POA: Insufficient documentation

## 2016-01-18 DIAGNOSIS — M1712 Unilateral primary osteoarthritis, left knee: Secondary | ICD-10-CM | POA: Insufficient documentation

## 2016-01-18 DIAGNOSIS — X501XXA Overexertion from prolonged static or awkward postures, initial encounter: Secondary | ICD-10-CM | POA: Diagnosis not present

## 2016-01-18 DIAGNOSIS — Y999 Unspecified external cause status: Secondary | ICD-10-CM | POA: Insufficient documentation

## 2016-01-18 DIAGNOSIS — M1990 Unspecified osteoarthritis, unspecified site: Secondary | ICD-10-CM | POA: Diagnosis not present

## 2016-01-18 DIAGNOSIS — M25572 Pain in left ankle and joints of left foot: Secondary | ICD-10-CM

## 2016-01-18 DIAGNOSIS — F17210 Nicotine dependence, cigarettes, uncomplicated: Secondary | ICD-10-CM | POA: Insufficient documentation

## 2016-01-18 DIAGNOSIS — Z01818 Encounter for other preprocedural examination: Secondary | ICD-10-CM | POA: Diagnosis not present

## 2016-01-18 DIAGNOSIS — M25562 Pain in left knee: Secondary | ICD-10-CM | POA: Diagnosis present

## 2016-01-18 MED ORDER — OXYCODONE HCL 5 MG PO TABS
5.0000 mg | ORAL_TABLET | Freq: Once | ORAL | Status: AC
  Administered 2016-01-18: 5 mg via ORAL
  Filled 2016-01-18: qty 1

## 2016-01-18 MED ORDER — OXYCODONE HCL 5 MG PO TABS: 5.0000 mg | ORAL_TABLET | Freq: Three times a day (TID) | ORAL | Status: DC | PRN

## 2016-01-18 MED ORDER — CYCLOBENZAPRINE HCL 10 MG PO TABS: 10.0000 mg | ORAL_TABLET | Freq: Three times a day (TID) | ORAL | Status: DC | PRN

## 2016-01-18 MED ORDER — CYCLOBENZAPRINE HCL 10 MG PO TABS
10.0000 mg | ORAL_TABLET | Freq: Once | ORAL | Status: AC
  Administered 2016-01-18: 10 mg via ORAL
  Filled 2016-01-18: qty 1

## 2016-01-18 NOTE — ED Notes (Signed)
Presents with left knee pain  States she missed a step yesterday and twisted her left knee .Marland Kitchen Having increased pain and swelling to lower leg

## 2016-01-18 NOTE — ED Provider Notes (Signed)
[facility] Emergency Department Provider Note   ____________________________________________  Time seen: Approximately 5:29 PM  I have reviewed the triage vital signs and the nursing notes.   HISTORY  Chief Complaint Knee Pain    HPI Katherine Hartman is a 65 y.o. female patient complaining of left knee pain secondary to a twisting incident yesterday. Patient states she was attending a funeral and twisted her her kneeuneven ground. Patient state she continued to have pain and edema which was noticed on MRI of her knee today. Patient was advised to come to ER for evaluation. The left knee is scheduled for replacement next month. Patient states pain increase with weightbearing.     Past Medical History  Diagnosis Date  . Arthritis   . Hypertension   . PONV (postoperative nausea and vomiting)   . GERD (gastroesophageal reflux disease)     Patient Active Problem List   Diagnosis Date Noted  . Primary osteoarthritis of right knee 08/24/2015    Past Surgical History  Procedure Laterality Date  . Abdominal hysterectomy    . Tubal ligation    . Bladder suspension N/A   . Total knee arthroplasty Right 08/24/2015    Procedure: TOTAL KNEE ARTHROPLASTY;  Surgeon: Hessie Knows, MD;  Location: [facility] ORS;  Service: Orthopedics;  Laterality: Right;  . Joint replacement Right     knee    Current Outpatient Rx  Name  Route  Sig  Dispense  Refill  . cyclobenzaprine (FLEXERIL) 10 MG tablet   Oral   Take 1 tablet (10 mg total) by mouth every 8 (eight) hours as needed for muscle spasms.   21 tablet   0   . cyclobenzaprine (FLEXERIL) 10 MG tablet   Oral   Take 1 tablet (10 mg total) by mouth every 8 (eight) hours as needed for muscle spasms.   30 tablet   1   . cyclobenzaprine (FLEXERIL) 10 MG tablet   Oral   Take 1 tablet (10 mg total) by mouth 3 (three) times daily as needed for muscle spasms.   15 tablet   0   . cyclobenzaprine (FLEXERIL) 5 MG  tablet   Oral   Take 5 mg by mouth 2 (two) times daily as needed for muscle spasms.         Marland Kitchen enoxaparin (LOVENOX) 40 MG/0.4ML injection   Subcutaneous   Inject 0.4 mLs (40 mg total) into the skin daily. X 14 days   14 Syringe   0   . lisinopril-hydrochlorothiazide (PRINZIDE,ZESTORETIC) 20-25 MG tablet   Oral   Take 1 tablet by mouth every morning.         . meloxicam (MOBIC) 15 MG tablet   Oral   Take 1 tablet (15 mg total) by mouth daily.   30 tablet   0   . omeprazole (PRILOSEC) 40 MG capsule   Oral   Take 40 mg by mouth daily.         Marland Kitchen oxyCODONE (ROXICODONE) 5 MG immediate release tablet   Oral   Take 1 tablet (5 mg total) by mouth every 6 (six) hours as needed for severe pain.   6 tablet   0   . oxyCODONE (ROXICODONE) 5 MG immediate release tablet   Oral   Take 1 tablet (5 mg total) by mouth every 8 (eight) hours as needed.   20 tablet   0     Allergies Ibuprofen  Family History  Problem Relation Age of Onset  .  Cancer Mother   . Hypertension Mother   . Diabetes Mother     Social History Social History  Substance Use Topics  . Smoking status: Current Every Day Smoker -- 0.10 packs/day    Types: Cigarettes  . Smokeless tobacco: Current User  . Alcohol Use: No     Comment: stopped in Sept. 2016    Review of Systems Constitutional: No fever/chills Eyes: No visual changes. ENT: No sore throat. Cardiovascular: Denies chest pain. Respiratory: Denies shortness of breath. Gastrointestinal: No abdominal pain.  No nausea, no vomiting.  No diarrhea.  No constipation. Genitourinary: Negative for dysuria. Musculoskeletal: Left knee pain Skin: Negative for rash. Neurological: Negative for headaches, focal weakness or numbness. Endocrine:Hypertension ____________________________________________   PHYSICAL EXAM:  VITAL SIGNS: ED Triage Vitals  Enc Vitals Group     BP 01/18/16 1659 137/94 mmHg     Pulse Rate 01/18/16 1659 105     Resp  01/18/16 1659 20     Temp 01/18/16 1659 99.5 F (37.5 C)     Temp Source 01/18/16 1659 Oral     SpO2 01/18/16 1659 98 %     Weight 01/18/16 1658 218 lb (98.884 kg)     Height 01/18/16 1659 5\' 8"  (1.727 m)     Head Cir --      Peak Flow --      Pain Score 01/18/16 1656 8     Pain Loc --      Pain Edu? --      Excl. in [place]? --     Constitutional: Alert and oriented. Well appearing and in no acute distress. Eyes: Conjunctivae are normal. PERRL. EOMI. Head: Atraumatic. Nose: No congestion/rhinnorhea. Mouth/Throat: Mucous membranes are moist.  Oropharynx non-erythematous. Neck: No stridor.  No cervical spine tenderness to palpation. Hematological/Lymphatic/Immunilogical: No cervical lymphadenopathy. Cardiovascular: Normal rate, regular rhythm. Grossly normal heart sounds.  Good peripheral circulation. Respiratory: Normal respiratory effort.  No retractions. Lungs CTAB. Gastrointestinal: Soft and nontender. No distention. No abdominal bruits. No CVA tenderness. Musculoskeletal: Surgical scar right knee secondary. No gross deformity of the left knee. Obvious edema. Marked crepitus with palpation.  Neurologic:  Normal speech and language. No gross focal neurologic deficits are appreciated. No gait instability. Skin:  Skin is warm, dry and intact. No rash noted. Psychiatric: Mood and affect are normal. Speech and behavior are normal.  ____________________________________________   LABS (all labs ordered are listed, but only abnormal results are displayed)  Labs Reviewed - No data to display ____________________________________________  EKG   ____________________________________________  RADIOLOGY   ____________________________________________   PROCEDURES  Procedure(s) performed: None  Critical Care performed: No  ____________________________________________   INITIAL IMPRESSION / ASSESSMENT AND PLAN / ED COURSE  Pertinent labs & imaging results that were available  during my care of the patient were reviewed by me and considered in my medical decision making (see chart for details).  Sprain left knee. Patient given discharge care instructions. Patient given prescriptions for oxycodone and Flexeril. Patient advised follow-up with orthopedic clinic for continued care. ____________________________________________   FINAL CLINICAL IMPRESSION(S) / ED DIAGNOSES  Final diagnoses:  Sprain of left knee, initial encounter      NEW MEDICATIONS STARTED DURING THIS VISIT:  New Prescriptions   CYCLOBENZAPRINE (FLEXERIL) 10 MG TABLET    Take 1 tablet (10 mg total) by mouth 3 (three) times daily as needed for muscle spasms.   OXYCODONE (ROXICODONE) 5 MG IMMEDIATE RELEASE TABLET    Take 1 tablet (5 mg total) by mouth  every 8 (eight) hours as needed.     Note:  This document was prepared using Dragon voice recognition software and may include unintentional dictation errors.    Sable Feil, PA-C 01/18/16 1735  Schuyler Amor, MD 01/18/16 2018

## 2016-01-19 ENCOUNTER — Other Ambulatory Visit: Payer: Self-pay | Admitting: Physician Assistant

## 2016-01-19 DIAGNOSIS — M25571 Pain in right ankle and joints of right foot: Secondary | ICD-10-CM

## 2016-02-14 ENCOUNTER — Encounter
Admission: RE | Admit: 2016-02-14 | Discharge: 2016-02-14 | Disposition: A | Discharge status: Home / Self Care | Payer: Medicare Other | Source: Ambulatory Visit | Attending: Orthopedic Surgery | Admitting: Orthopedic Surgery

## 2016-02-14 DIAGNOSIS — I10 Essential (primary) hypertension: Secondary | ICD-10-CM | POA: Insufficient documentation

## 2016-02-14 DIAGNOSIS — K219 Gastro-esophageal reflux disease without esophagitis: Secondary | ICD-10-CM | POA: Insufficient documentation

## 2016-02-14 DIAGNOSIS — Z01812 Encounter for preprocedural laboratory examination: Secondary | ICD-10-CM | POA: Diagnosis not present

## 2016-02-14 LAB — BASIC METABOLIC PANEL
Anion gap: 9 (ref 5–15)
BUN: 20 mg/dL (ref 6–20)
CALCIUM: 9.7 mg/dL (ref 8.9–10.3)
CHLORIDE: 100 mmol/L — AB (ref 101–111)
CO2: 27 mmol/L (ref 22–32)
CREATININE: 1.07 mg/dL — AB (ref 0.44–1.00)
GFR calc non Af Amer: 53 mL/min — ABNORMAL LOW (ref >60)
GLUCOSE: 79 mg/dL (ref 65–99)
Potassium: 3.4 mmol/L — ABNORMAL LOW (ref 3.5–5.1)
Sodium: 136 mmol/L (ref 135–145)

## 2016-02-14 LAB — TYPE AND SCREEN
ABO/RH(D): B POS
Antibody Screen: NEGATIVE

## 2016-02-14 LAB — CBC
HEMATOCRIT: 39 % (ref 35.0–47.0)
HEMOGLOBIN: 12.8 g/dL (ref 12.0–16.0)
MCH: 26.1 pg (ref 26.0–34.0)
MCHC: 32.8 g/dL (ref 32.0–36.0)
MCV: 79.7 fL — AB (ref 80.0–100.0)
Platelets: 306 10*3/uL (ref 150–440)
RBC: 4.89 MIL/uL (ref 3.80–5.20)
RDW: 17.3 % — AB (ref 11.5–14.5)
WBC: 10.5 10*3/uL (ref 3.6–11.0)

## 2016-02-14 LAB — URINALYSIS COMPLETE WITH MICROSCOPIC (ARMC ONLY)
BILIRUBIN URINE: NEGATIVE
Bacteria, UA: NONE SEEN
Glucose, UA: NEGATIVE mg/dL
HGB URINE DIPSTICK: NEGATIVE
KETONES UR: NEGATIVE mg/dL
NITRITE: NEGATIVE
PH: 5 (ref 5.0–8.0)
PROTEIN: NEGATIVE mg/dL
SPECIFIC GRAVITY, URINE: 1.013 (ref 1.005–1.030)

## 2016-02-14 LAB — SEDIMENTATION RATE: Sed Rate: 42 mm/hr — ABNORMAL HIGH (ref 0–30)

## 2016-02-14 LAB — APTT: APTT: 28 s (ref 24–36)

## 2016-02-14 LAB — SURGICAL PCR SCREEN
MRSA, PCR: NEGATIVE
STAPHYLOCOCCUS AUREUS: NEGATIVE

## 2016-02-14 LAB — PROTIME-INR
INR: 1.05
Prothrombin Time: 13.9 seconds (ref 11.4–15.0)

## 2016-02-14 NOTE — Pre-Procedure Instructions (Signed)
Author: Nance Pear, MD Service: (none) Author Type: Physician   Filed: 11/14/2015 4:06 PM Note Time: 11/14/2015 4:06 PM Status: Signed   Editor: Nance Pear, MD (Physician)     Expand All Collapse All   I, Nance Pear, attending physician, personally viewed and interpreted this EKG  EKG Time: 1531 Rate: 101 Rhythm: sinus tachycardia Axis: normal Intervals: qtc 542 QRS: nonspecific intraventricular block ST changes: no st elevation equivalent Impression: abnormal ekg

## 2016-02-14 NOTE — Patient Instructions (Signed)
  Your procedure is scheduled on: 02/20/16 Tues Report to Same Day Surgery 2nd floor medical mall To find out your arrival time please call [phone] between 1PM - 3PM on 02/19/16 Mon  Remember: Instructions that are not followed completely may result in serious medical risk, up to and including death, or upon the discretion of your surgeon and anesthesiologist your surgery may need to be rescheduled.    _x___ 1. Do not eat food or drink liquids after midnight. No gum chewing or hard candies.     __x__ 2. No Alcohol for 24 hours before or after surgery.   __x__3. No Smoking for 24 prior to surgery.   ____  4. Bring all medications with you on the day of surgery if instructed.    __x__ 5. Notify your doctor if there is any change in your medical condition     (cold, fever, infections).     Do not wear jewelry, make-up, hairpins, clips or nail polish.  Do not wear lotions, powders, or perfumes. You may wear deodorant.  Do not shave 48 hours prior to surgery. Men may shave face and neck.  Do not bring valuables to the hospital.    [facility] is not responsible for any belongings or valuables.               Contacts, dentures or bridgework may not be worn into surgery.  Leave your suitcase in the car. After surgery it may be brought to your room.  For patients admitted to the hospital, discharge time is determined by your treatment team.   Patients discharged the day of surgery will not be allowed to drive home.    Please read over the following fact sheets that you were given:   [facility] Preparing for Surgery and or MRSA Information   _x___ Take these medicines the morning of surgery with A SIP OF WATER:    1. omeprazole (PRILOSEC) 40 MG capsule  2.  3.  4.  5.  6.  ____ Fleet Enema (as directed)   _x___ Use CHG Soap or sage wipes as directed on instruction sheet   ____ Use inhalers on the day of surgery and bring to hospital day of surgery  ____ Stop metformin 2  days prior to surgery    ____ Take 1/2 of usual insulin dose the night before surgery and none on the morning of           surgery.   ____ Stop aspirin or coumadin, or plavix  _x__ Stop Anti-inflammatories such as Advil, Aleve, Ibuprofen, Motrin, Naproxen,          Naprosyn, Goodies powders or aspirin products. Ok to take Tylenol.Stopped Meloxicam on Sat.  ____ Stop supplements until after surgery.    ____ Bring C-Pap to the hospital.

## 2016-02-14 NOTE — Pre-Procedure Instructions (Signed)
EKG  EKG revealed a sinus tachycardia with a ventricular rate of 101 bpm. There is notation of nonspecific intraventricular block but no evidence of ST elevation. No significant changes as compared to EKG on file from July 2016. EKG was also reviewed by Dr. Nance Pear. ____________________________________________

## 2016-02-15 LAB — URINE CULTURE

## 2016-02-27 ENCOUNTER — Ambulatory Visit
Admission: RE | Admit: 2016-02-27 | Discharge: 2016-02-27 | Disposition: A | Discharge status: Home / Self Care | Payer: Medicare Other | Source: Ambulatory Visit | Attending: Orthopedic Surgery | Admitting: Orthopedic Surgery

## 2016-02-27 ENCOUNTER — Encounter: Payer: Self-pay | Admitting: *Deleted

## 2016-02-27 ENCOUNTER — Encounter
Admission: RE | Disposition: A | Discharge status: Home / Self Care | Payer: Self-pay | Source: Ambulatory Visit | Attending: Orthopedic Surgery

## 2016-02-27 DIAGNOSIS — M1711 Unilateral primary osteoarthritis, right knee: Secondary | ICD-10-CM | POA: Diagnosis not present

## 2016-02-27 DIAGNOSIS — Z01812 Encounter for preprocedural laboratory examination: Secondary | ICD-10-CM | POA: Insufficient documentation

## 2016-02-27 DIAGNOSIS — K219 Gastro-esophageal reflux disease without esophagitis: Secondary | ICD-10-CM | POA: Diagnosis not present

## 2016-02-27 DIAGNOSIS — I10 Essential (primary) hypertension: Secondary | ICD-10-CM | POA: Insufficient documentation

## 2016-02-27 LAB — URINE DRUG SCREEN, QUALITATIVE (ARMC ONLY)
Amphetamines, Ur Screen: NOT DETECTED
BARBITURATES, UR SCREEN: NOT DETECTED
BENZODIAZEPINE, UR SCRN: NOT DETECTED
COCAINE METABOLITE, UR ~~LOC~~: POSITIVE — AB
Cannabinoid 50 Ng, Ur ~~LOC~~: NOT DETECTED
MDMA (Ecstasy)Ur Screen: NOT DETECTED
METHADONE SCREEN, URINE: NOT DETECTED
Opiate, Ur Screen: NOT DETECTED
Phencyclidine (PCP) Ur S: NOT DETECTED
TRICYCLIC, UR SCREEN: NOT DETECTED

## 2016-02-27 SURGERY — ARTHROPLASTY, KNEE, TOTAL
Anesthesia: Choice | Laterality: Left

## 2016-02-27 MED ORDER — CEFAZOLIN SODIUM-DEXTROSE 2-4 GM/100ML-% IV SOLN: 2.0000 g | Freq: Once | INTRAVENOUS | Status: DC

## 2016-02-27 MED ORDER — BISOPROLOL FUMARATE 5 MG PO TABS
5.0000 mg | ORAL_TABLET | Freq: Once | ORAL | Status: AC
  Administered 2016-02-27: 5 mg via ORAL
  Filled 2016-02-27: qty 1

## 2016-02-27 MED ORDER — LACTATED RINGERS IV SOLN: INTRAVENOUS | Status: DC

## 2016-02-27 MED ORDER — TRANEXAMIC ACID 1000 MG/10ML IV SOLN
1500.0000 mg | INTRAVENOUS | Status: DC
  Filled 2016-02-27: qty 15

## 2016-02-27 MED ORDER — CEFAZOLIN SODIUM-DEXTROSE 2-4 GM/100ML-% IV SOLN
INTRAVENOUS | Status: AC
  Filled 2016-02-27: qty 100

## 2016-02-27 MED ORDER — NEOMYCIN-POLYMYXIN B GU 40-200000 IR SOLN
Status: AC
  Filled 2016-02-27: qty 20

## 2016-02-27 MED ORDER — SODIUM CHLORIDE 0.9 % IJ SOLN
INTRAMUSCULAR | Status: AC
  Filled 2016-02-27: qty 100

## 2016-02-27 MED ORDER — BUPIVACAINE-EPINEPHRINE (PF) 0.25% -1:200000 IJ SOLN
INTRAMUSCULAR | Status: AC
  Filled 2016-02-27: qty 30

## 2016-02-27 MED ORDER — BUPIVACAINE LIPOSOME 1.3 % IJ SUSP
INTRAMUSCULAR | Status: AC
  Filled 2016-02-27: qty 20

## 2016-02-27 SURGICAL SUPPLY — 53 items
BANDAGE ACE 6X5 VEL STRL LF (GAUZE/BANDAGES/DRESSINGS) IMPLANT
BLADE SAW 1 (BLADE) IMPLANT
CANISTER SUCT 1200ML W/VALVE (MISCELLANEOUS) IMPLANT
CANISTER SUCT 3000ML (MISCELLANEOUS) IMPLANT
CATH FOL LEG HOLDER (MISCELLANEOUS) IMPLANT
CATH TRAY METER 16FR LF (MISCELLANEOUS) IMPLANT
CHLORAPREP W/TINT 26ML (MISCELLANEOUS) IMPLANT
COOLER POLAR GLACIER W/PUMP (MISCELLANEOUS) IMPLANT
CUFF TOURN 24 STER (MISCELLANEOUS) IMPLANT
CUFF TOURN 30 STER DUAL PORT (MISCELLANEOUS) IMPLANT
DRAPE INCISE IOBAN 66X45 STRL (DRAPES) IMPLANT
DRAPE SHEET LG 3/4 BI-LAMINATE (DRAPES) IMPLANT
ELECT CAUTERY BLADE 6.4 (BLADE) IMPLANT
ELECT REM PT RETURN 9FT ADLT (ELECTROSURGICAL)
ELECTRODE REM PT RTRN 9FT ADLT (ELECTROSURGICAL) IMPLANT
GAUZE PETRO XEROFOAM 1X8 (MISCELLANEOUS) IMPLANT
GAUZE SPONGE 4X4 12PLY STRL (GAUZE/BANDAGES/DRESSINGS) IMPLANT
GLOVE BIOGEL PI IND STRL 9 (GLOVE) IMPLANT
GLOVE BIOGEL PI INDICATOR 9 (GLOVE)
GLOVE INDICATOR 8.0 STRL GRN (GLOVE) IMPLANT
GLOVE SURG ORTHO 8.0 STRL STRW (GLOVE) IMPLANT
GLOVE SURG ORTHO 9.0 STRL STRW (GLOVE) IMPLANT
GOWN SRG 2XL LVL 4 RGLN SLV (GOWNS) IMPLANT
GOWN STRL NON-REIN 2XL LVL4 (GOWNS)
GOWN STRL REUS W/ TWL LRG LVL3 (GOWN DISPOSABLE) IMPLANT
GOWN STRL REUS W/ TWL XL LVL3 (GOWN DISPOSABLE) IMPLANT
GOWN STRL REUS W/TWL LRG LVL3 (GOWN DISPOSABLE)
GOWN STRL REUS W/TWL XL LVL3 (GOWN DISPOSABLE)
HANDPIECE INTERPULSE COAX TIP (DISPOSABLE)
HOOD PEEL AWAY FLYTE STAYCOOL (MISCELLANEOUS) IMPLANT
IMMBOLIZER KNEE 19 BLUE UNIV (SOFTGOODS) IMPLANT
KIT RM TURNOVER STRD PROC AR (KITS) IMPLANT
KNIFE SCULPS 14X20 (INSTRUMENTS) IMPLANT
NDL SAFETY 18GX1.5 (NEEDLE) IMPLANT
NEEDLE SPNL 18GX3.5 QUINCKE PK (NEEDLE) IMPLANT
NEEDLE SPNL 20GX3.5 QUINCKE YW (NEEDLE) IMPLANT
NS IRRIG 1000ML POUR BTL (IV SOLUTION) IMPLANT
PACK TOTAL KNEE (MISCELLANEOUS) IMPLANT
PAD WRAPON POLAR KNEE (MISCELLANEOUS) IMPLANT
SET HNDPC FAN SPRY TIP SCT (DISPOSABLE) IMPLANT
SOL .9 NS 3000ML IRR  AL (IV SOLUTION)
SOL .9 NS 3000ML IRR UROMATIC (IV SOLUTION) IMPLANT
STAPLER SKIN PROX 35W (STAPLE) IMPLANT
SUCTION FRAZIER HANDLE 10FR (MISCELLANEOUS)
SUCTION TUBE FRAZIER 10FR DISP (MISCELLANEOUS) IMPLANT
SUT DVC 2 QUILL PDO  T11 36X36 (SUTURE)
SUT DVC 2 QUILL PDO T11 36X36 (SUTURE) IMPLANT
SUT DVC QUILL MONODERM 30X30 (SUTURE) IMPLANT
SYR 20CC LL (SYRINGE) IMPLANT
SYR 50ML LL SCALE MARK (SYRINGE) IMPLANT
TOWEL OR 17X26 4PK STRL BLUE (TOWEL DISPOSABLE) IMPLANT
TOWER CARTRIDGE SMART MIX (DISPOSABLE) IMPLANT
WRAPON POLAR PAD KNEE (MISCELLANEOUS)

## 2016-02-27 NOTE — OR Nursing (Signed)
Dr. Kayleen Memos and Dr. Rudene Christians are in agreement to cancel procedure for today. Patient has been instructed to call office to reschedule when she can be free of cocaine.

## 2016-02-27 NOTE — OR Nursing (Signed)
Urine drug screen came back positive for cocaine. Patient admitted to smoking marijuana 2 and 1/2 weeks ago. She denies using cocaine but reports being around a friend who smokes it.

## 2016-03-05 ENCOUNTER — Inpatient Hospital Stay: Payer: Medicare Other

## 2016-03-05 ENCOUNTER — Encounter: Payer: Self-pay | Admitting: *Deleted

## 2016-03-05 ENCOUNTER — Inpatient Hospital Stay: Payer: Medicare Other | Admitting: Anesthesiology

## 2016-03-05 ENCOUNTER — Inpatient Hospital Stay: Admission: RE | Admit: 2016-03-05 | Payer: Medicare Other | Source: Ambulatory Visit | Admitting: Orthopedic Surgery

## 2016-03-05 ENCOUNTER — Encounter
Admission: RE | Disposition: A | Discharge status: Skilled Nursing Facility | Payer: Self-pay | Source: Ambulatory Visit | Attending: Orthopedic Surgery

## 2016-03-05 ENCOUNTER — Inpatient Hospital Stay
Admission: RE | Admit: 2016-03-05 | Discharge: 2016-03-08 | DRG: 470 | Disposition: A | Discharge status: Skilled Nursing Facility | Payer: Medicare Other | Source: Ambulatory Visit | Attending: Orthopedic Surgery | Admitting: Orthopedic Surgery

## 2016-03-05 DIAGNOSIS — Z5189 Encounter for other specified aftercare: Secondary | ICD-10-CM | POA: Diagnosis not present

## 2016-03-05 DIAGNOSIS — D62 Acute posthemorrhagic anemia: Secondary | ICD-10-CM | POA: Diagnosis not present

## 2016-03-05 DIAGNOSIS — M1990 Unspecified osteoarthritis, unspecified site: Secondary | ICD-10-CM | POA: Diagnosis present

## 2016-03-05 DIAGNOSIS — G8918 Other acute postprocedural pain: Secondary | ICD-10-CM

## 2016-03-05 DIAGNOSIS — R262 Difficulty in walking, not elsewhere classified: Secondary | ICD-10-CM | POA: Diagnosis not present

## 2016-03-05 DIAGNOSIS — K219 Gastro-esophageal reflux disease without esophagitis: Secondary | ICD-10-CM | POA: Diagnosis present

## 2016-03-05 DIAGNOSIS — K210 Gastro-esophageal reflux disease with esophagitis: Secondary | ICD-10-CM | POA: Diagnosis not present

## 2016-03-05 DIAGNOSIS — Z471 Aftercare following joint replacement surgery: Secondary | ICD-10-CM | POA: Diagnosis not present

## 2016-03-05 DIAGNOSIS — F17210 Nicotine dependence, cigarettes, uncomplicated: Secondary | ICD-10-CM | POA: Diagnosis present

## 2016-03-05 DIAGNOSIS — I10 Essential (primary) hypertension: Secondary | ICD-10-CM | POA: Diagnosis present

## 2016-03-05 DIAGNOSIS — M6281 Muscle weakness (generalized): Secondary | ICD-10-CM | POA: Diagnosis not present

## 2016-03-05 DIAGNOSIS — M1712 Unilateral primary osteoarthritis, left knee: Secondary | ICD-10-CM | POA: Diagnosis not present

## 2016-03-05 DIAGNOSIS — Z743 Need for continuous supervision: Secondary | ICD-10-CM | POA: Diagnosis not present

## 2016-03-05 DIAGNOSIS — D509 Iron deficiency anemia, unspecified: Secondary | ICD-10-CM | POA: Diagnosis not present

## 2016-03-05 DIAGNOSIS — M25562 Pain in left knee: Secondary | ICD-10-CM | POA: Diagnosis not present

## 2016-03-05 HISTORY — PX: TOTAL KNEE ARTHROPLASTY: SHX125

## 2016-03-05 LAB — URINE DRUG SCREEN, QUALITATIVE (ARMC ONLY)
AMPHETAMINES, UR SCREEN: NOT DETECTED
BARBITURATES, UR SCREEN: NOT DETECTED
Benzodiazepine, Ur Scrn: NOT DETECTED
COCAINE METABOLITE, UR ~~LOC~~: NOT DETECTED
Cannabinoid 50 Ng, Ur ~~LOC~~: POSITIVE — AB
MDMA (ECSTASY) UR SCREEN: NOT DETECTED
METHADONE SCREEN, URINE: NOT DETECTED
OPIATE, UR SCREEN: POSITIVE — AB
Phencyclidine (PCP) Ur S: NOT DETECTED
TRICYCLIC, UR SCREEN: POSITIVE — AB

## 2016-03-05 LAB — TYPE AND SCREEN
ABO/RH(D): B POS
Antibody Screen: NEGATIVE

## 2016-03-05 LAB — CBC
HCT: 33.1 % — ABNORMAL LOW (ref 35.0–47.0)
Hemoglobin: 11.1 g/dL — ABNORMAL LOW (ref 12.0–16.0)
MCH: 26.7 pg (ref 26.0–34.0)
MCHC: 33.6 g/dL (ref 32.0–36.0)
MCV: 79.5 fL — ABNORMAL LOW (ref 80.0–100.0)
PLATELETS: 274 10*3/uL (ref 150–440)
RBC: 4.16 MIL/uL (ref 3.80–5.20)
RDW: 16.9 % — AB (ref 11.5–14.5)
WBC: 8.2 10*3/uL (ref 3.6–11.0)

## 2016-03-05 LAB — CREATININE, SERUM: CREATININE: 0.96 mg/dL (ref 0.44–1.00)

## 2016-03-05 SURGERY — ARTHROPLASTY, KNEE, TOTAL
Anesthesia: Choice | Laterality: Left

## 2016-03-05 SURGERY — ARTHROPLASTY, KNEE, TOTAL
Anesthesia: Spinal | Laterality: Left

## 2016-03-05 MED ORDER — MENTHOL 3 MG MT LOZG
1.0000 | LOZENGE | OROMUCOSAL | Status: DC | PRN
  Filled 2016-03-05: qty 9

## 2016-03-05 MED ORDER — BISACODYL 5 MG PO TBEC: 5.0000 mg | DELAYED_RELEASE_TABLET | Freq: Every day | ORAL | Status: DC | PRN

## 2016-03-05 MED ORDER — MIDAZOLAM HCL 5 MG/5ML IJ SOLN
INTRAMUSCULAR | Status: DC | PRN
  Administered 2016-03-05: 1 mg via INTRAVENOUS
  Administered 2016-03-05: 2 mg via INTRAVENOUS
  Administered 2016-03-05: 1 mg via INTRAVENOUS

## 2016-03-05 MED ORDER — METOCLOPRAMIDE HCL 5 MG/ML IJ SOLN: 5.0000 mg | Freq: Three times a day (TID) | INTRAMUSCULAR | Status: DC | PRN

## 2016-03-05 MED ORDER — ENOXAPARIN SODIUM 30 MG/0.3ML ~~LOC~~ SOLN
30.0000 mg | Freq: Two times a day (BID) | SUBCUTANEOUS | Status: DC
  Administered 2016-03-06 – 2016-03-08 (×5): 30 mg via SUBCUTANEOUS
  Filled 2016-03-05 (×5): qty 0.3

## 2016-03-05 MED ORDER — BUPIVACAINE-EPINEPHRINE (PF) 0.25% -1:200000 IJ SOLN
INTRAMUSCULAR | Status: AC
  Filled 2016-03-05: qty 30

## 2016-03-05 MED ORDER — MORPHINE SULFATE 10 MG/ML IJ SOLN
INTRAMUSCULAR | Status: DC | PRN
  Administered 2016-03-05: 10 mg via INTRAVENOUS

## 2016-03-05 MED ORDER — PROMETHAZINE HCL 25 MG PO TABS: 25.0000 mg | ORAL_TABLET | Freq: Four times a day (QID) | ORAL | Status: DC | PRN

## 2016-03-05 MED ORDER — BISOPROLOL-HYDROCHLOROTHIAZIDE 5-6.25 MG PO TABS
1.0000 | ORAL_TABLET | Freq: Every day | ORAL | Status: DC
  Administered 2016-03-06 – 2016-03-08 (×3): 1 via ORAL
  Filled 2016-03-05 (×3): qty 1

## 2016-03-05 MED ORDER — ONDANSETRON HCL 4 MG/2ML IJ SOLN
4.0000 mg | Freq: Four times a day (QID) | INTRAMUSCULAR | Status: DC | PRN
Start: 2016-03-05 — End: 2016-03-08

## 2016-03-05 MED ORDER — GLYCOPYRROLATE 0.2 MG/ML IJ SOLN
INTRAMUSCULAR | Status: DC | PRN
  Administered 2016-03-05: 0.2 mg via INTRAVENOUS

## 2016-03-05 MED ORDER — PANTOPRAZOLE SODIUM 40 MG PO TBEC
40.0000 mg | DELAYED_RELEASE_TABLET | Freq: Every day | ORAL | Status: DC
  Administered 2016-03-06 – 2016-03-08 (×3): 40 mg via ORAL
  Filled 2016-03-05 (×3): qty 1

## 2016-03-05 MED ORDER — ONDANSETRON HCL 4 MG PO TABS: 4.0000 mg | ORAL_TABLET | Freq: Four times a day (QID) | ORAL | Status: DC | PRN

## 2016-03-05 MED ORDER — CEFAZOLIN SODIUM-DEXTROSE 2-4 GM/100ML-% IV SOLN: 2.0000 g | Freq: Once | INTRAVENOUS | Status: DC

## 2016-03-05 MED ORDER — DIPHENHYDRAMINE HCL 25 MG PO TABS
25.0000 mg | ORAL_TABLET | Freq: Every day | ORAL | Status: DC | PRN
  Administered 2016-03-07 – 2016-03-08 (×3): 25 mg via ORAL
  Filled 2016-03-05 (×7): qty 1

## 2016-03-05 MED ORDER — ACETAMINOPHEN 325 MG PO TABS
650.0000 mg | ORAL_TABLET | Freq: Four times a day (QID) | ORAL | Status: DC | PRN
  Filled 2016-03-05: qty 2

## 2016-03-05 MED ORDER — NEOMYCIN-POLYMYXIN B GU 40-200000 IR SOLN
Status: DC | PRN
  Administered 2016-03-05: 4 mL

## 2016-03-05 MED ORDER — KETAMINE HCL 10 MG/ML IJ SOLN
INTRAMUSCULAR | Status: DC | PRN
  Administered 2016-03-05: 50 mg via INTRAVENOUS

## 2016-03-05 MED ORDER — BUPIVACAINE-EPINEPHRINE (PF) 0.25% -1:200000 IJ SOLN
INTRAMUSCULAR | Status: DC | PRN
  Administered 2016-03-05: 30 mL via PERINEURAL

## 2016-03-05 MED ORDER — CEFAZOLIN SODIUM-DEXTROSE 2-4 GM/100ML-% IV SOLN
2.0000 g | Freq: Four times a day (QID) | INTRAVENOUS | Status: AC
  Administered 2016-03-05 – 2016-03-06 (×3): 2 g via INTRAVENOUS
  Filled 2016-03-05 (×4): qty 100

## 2016-03-05 MED ORDER — CEFAZOLIN SODIUM-DEXTROSE 2-4 GM/100ML-% IV SOLN
2.0000 g | Freq: Once | INTRAVENOUS | Status: AC
  Administered 2016-03-05: 2 g via INTRAVENOUS

## 2016-03-05 MED ORDER — PROPOFOL 500 MG/50ML IV EMUL
INTRAVENOUS | Status: DC | PRN
  Administered 2016-03-05: 50 ug/kg/min via INTRAVENOUS

## 2016-03-05 MED ORDER — CYCLOBENZAPRINE HCL 10 MG PO TABS
10.0000 mg | ORAL_TABLET | Freq: Three times a day (TID) | ORAL | Status: DC | PRN
  Administered 2016-03-05 – 2016-03-08 (×7): 10 mg via ORAL
  Filled 2016-03-05 (×7): qty 1

## 2016-03-05 MED ORDER — BUPIVACAINE LIPOSOME 1.3 % IJ SUSP
INTRAMUSCULAR | Status: AC
  Filled 2016-03-05: qty 20

## 2016-03-05 MED ORDER — SODIUM CHLORIDE 0.9 % IV SOLN
INTRAVENOUS | Status: DC | PRN
  Administered 2016-03-05: 60 mL

## 2016-03-05 MED ORDER — TRANEXAMIC ACID 1000 MG/10ML IV SOLN
1500.0000 mg | Freq: Once | INTRAVENOUS | Status: AC
  Administered 2016-03-05: 1500 mg via INTRAVENOUS

## 2016-03-05 MED ORDER — DOCUSATE SODIUM 100 MG PO CAPS
100.0000 mg | ORAL_CAPSULE | Freq: Two times a day (BID) | ORAL | Status: DC
Start: 2016-03-05 — End: 2016-03-08
  Administered 2016-03-05 – 2016-03-08 (×6): 100 mg via ORAL
  Filled 2016-03-05 (×6): qty 1

## 2016-03-05 MED ORDER — BISOPROLOL-HYDROCHLOROTHIAZIDE 5-6.25 MG PO TABS
1.0000 | ORAL_TABLET | Freq: Once | ORAL | Status: AC
  Administered 2016-03-05: 1 via ORAL
  Filled 2016-03-05: qty 1

## 2016-03-05 MED ORDER — ADULT MULTIVITAMIN W/MINERALS CH
1.0000 | ORAL_TABLET | Freq: Every day | ORAL | Status: DC
  Administered 2016-03-06 – 2016-03-08 (×3): 1 via ORAL
  Filled 2016-03-05 (×3): qty 1

## 2016-03-05 MED ORDER — LIDOCAINE HCL (PF) 2 % IJ SOLN
INTRAMUSCULAR | Status: DC | PRN
  Administered 2016-03-05: 50 mg

## 2016-03-05 MED ORDER — NEOMYCIN-POLYMYXIN B GU 40-200000 IR SOLN
Status: AC
  Filled 2016-03-05: qty 1

## 2016-03-05 MED ORDER — METOCLOPRAMIDE HCL 10 MG PO TABS: 5.0000 mg | ORAL_TABLET | Freq: Three times a day (TID) | ORAL | Status: DC | PRN

## 2016-03-05 MED ORDER — BUPIVACAINE HCL (PF) 0.5 % IJ SOLN
INTRAMUSCULAR | Status: DC | PRN
  Administered 2016-03-05: 3 mL via INTRATHECAL

## 2016-03-05 MED ORDER — TRANEXAMIC ACID 1000 MG/10ML IV SOLN
INTRAVENOUS | Status: DC | PRN
  Administered 2016-03-05: 1500 mg via INTRAVENOUS

## 2016-03-05 MED ORDER — SODIUM CHLORIDE 0.9 % IV SOLN
1500.0000 mg | INTRAVENOUS | Status: DC
  Filled 2016-03-05: qty 15

## 2016-03-05 MED ORDER — SODIUM CHLORIDE 0.9 % IV SOLN
INTRAVENOUS | Status: DC
  Administered 2016-03-05 – 2016-03-06 (×2): via INTRAVENOUS

## 2016-03-05 MED ORDER — ONDANSETRON HCL 4 MG/2ML IJ SOLN: 4.0000 mg | Freq: Once | INTRAMUSCULAR | Status: DC | PRN

## 2016-03-05 MED ORDER — ACETAMINOPHEN 650 MG RE SUPP: 650.0000 mg | Freq: Four times a day (QID) | RECTAL | Status: DC | PRN

## 2016-03-05 MED ORDER — FAMOTIDINE 20 MG PO TABS
20.0000 mg | ORAL_TABLET | Freq: Once | ORAL | Status: AC
  Administered 2016-03-05: 20 mg via ORAL

## 2016-03-05 MED ORDER — MORPHINE SULFATE (PF) 10 MG/ML IV SOLN
INTRAVENOUS | Status: AC
  Filled 2016-03-05: qty 1

## 2016-03-05 MED ORDER — FENTANYL CITRATE (PF) 100 MCG/2ML IJ SOLN
INTRAMUSCULAR | Status: DC | PRN
  Administered 2016-03-05: 100 ug via INTRAVENOUS

## 2016-03-05 MED ORDER — CYCLOBENZAPRINE HCL 10 MG PO TABS: 10.0000 mg | ORAL_TABLET | Freq: Three times a day (TID) | ORAL | Status: DC | PRN

## 2016-03-05 MED ORDER — OXYCODONE HCL 5 MG PO TABS
5.0000 mg | ORAL_TABLET | ORAL | Status: DC | PRN
  Administered 2016-03-05 (×2): 5 mg via ORAL
  Administered 2016-03-05 – 2016-03-08 (×14): 10 mg via ORAL
  Administered 2016-03-08 (×2): 5 mg via ORAL
  Administered 2016-03-08: 10 mg via ORAL
  Filled 2016-03-05 (×8): qty 2
  Filled 2016-03-05: qty 1
  Filled 2016-03-05 (×4): qty 2
  Filled 2016-03-05 (×2): qty 1
  Filled 2016-03-05: qty 2
  Filled 2016-03-05: qty 1
  Filled 2016-03-05 (×3): qty 2

## 2016-03-05 MED ORDER — MAGNESIUM HYDROXIDE 400 MG/5ML PO SUSP: 30.0000 mL | Freq: Every day | ORAL | Status: DC | PRN

## 2016-03-05 MED ORDER — PHENOL 1.4 % MT LIQD
1.0000 | OROMUCOSAL | Status: DC | PRN
  Filled 2016-03-05: qty 177

## 2016-03-05 MED ORDER — MORPHINE SULFATE (PF) 2 MG/ML IV SOLN
2.0000 mg | INTRAVENOUS | Status: DC | PRN
  Administered 2016-03-05 – 2016-03-06 (×7): 2 mg via INTRAVENOUS
  Filled 2016-03-05 (×7): qty 1

## 2016-03-05 MED ORDER — FENTANYL CITRATE (PF) 100 MCG/2ML IJ SOLN: 25.0000 ug | INTRAMUSCULAR | Status: DC | PRN

## 2016-03-05 MED ORDER — MAGNESIUM CITRATE PO SOLN
1.0000 | Freq: Once | ORAL | Status: DC | PRN
  Filled 2016-03-05: qty 296

## 2016-03-05 MED ORDER — LACTATED RINGERS IV SOLN
INTRAVENOUS | Status: DC
  Administered 2016-03-05: 75 mL/h via INTRAVENOUS
  Administered 2016-03-05: 11:00:00 via INTRAVENOUS

## 2016-03-05 MED ORDER — BUPIVACAINE IN DEXTROSE 0.75-8.25 % IT SOLN
INTRATHECAL | Status: DC | PRN
  Administered 2016-03-05: 1.8 mL via INTRATHECAL

## 2016-03-05 MED ORDER — SODIUM CHLORIDE 0.9 % IJ SOLN
INTRAMUSCULAR | Status: AC
  Filled 2016-03-05: qty 100

## 2016-03-05 SURGICAL SUPPLY — 58 items
BANDAGE ACE 6X5 VEL STRL LF (GAUZE/BANDAGES/DRESSINGS) ×3 IMPLANT
BLADE SAW 1 (BLADE) ×3 IMPLANT
CANISTER SUCT 1200ML W/VALVE (MISCELLANEOUS) ×3 IMPLANT
CANISTER SUCT 3000ML (MISCELLANEOUS) ×6 IMPLANT
CAPT KNEE TOTAL 3 ×3 IMPLANT
CATH FOL LEG HOLDER (MISCELLANEOUS) ×3 IMPLANT
CATH TRAY METER 16FR LF (MISCELLANEOUS) ×3 IMPLANT
CEMENT HV SMART SET (Cement) ×6 IMPLANT
CHLORAPREP W/TINT 26ML (MISCELLANEOUS) ×6 IMPLANT
COOLER POLAR GLACIER W/PUMP (MISCELLANEOUS) ×3 IMPLANT
CUFF TOURN 24 STER (MISCELLANEOUS) IMPLANT
CUFF TOURN 30 STER DUAL PORT (MISCELLANEOUS) IMPLANT
DRAPE INCISE IOBAN 66X45 STRL (DRAPES) ×6 IMPLANT
DRAPE SHEET LG 3/4 BI-LAMINATE (DRAPES) ×6 IMPLANT
ELECT CAUTERY BLADE 6.4 (BLADE) ×3 IMPLANT
ELECT REM PT RETURN 9FT ADLT (ELECTROSURGICAL) ×3
ELECTRODE REM PT RTRN 9FT ADLT (ELECTROSURGICAL) ×1 IMPLANT
GAUZE PETRO XEROFOAM 1X8 (MISCELLANEOUS) ×6 IMPLANT
GAUZE SPONGE 4X4 12PLY STRL (GAUZE/BANDAGES/DRESSINGS) ×6 IMPLANT
GLOVE BIOGEL PI IND STRL 9 (GLOVE) ×1 IMPLANT
GLOVE BIOGEL PI INDICATOR 9 (GLOVE) ×2
GLOVE INDICATOR 8.0 STRL GRN (GLOVE) ×3 IMPLANT
GLOVE SURG ORTHO 8.0 STRL STRW (GLOVE) ×3 IMPLANT
GLOVE SURG ORTHO 9.0 STRL STRW (GLOVE) ×3 IMPLANT
GOWN SRG 2XL LVL 4 RGLN SLV (GOWNS) ×1 IMPLANT
GOWN STRL NON-REIN 2XL LVL4 (GOWNS) ×2
GOWN STRL REUS W/ TWL LRG LVL3 (GOWN DISPOSABLE) ×1 IMPLANT
GOWN STRL REUS W/ TWL XL LVL3 (GOWN DISPOSABLE) ×1 IMPLANT
GOWN STRL REUS W/TWL LRG LVL3 (GOWN DISPOSABLE) ×2
GOWN STRL REUS W/TWL XL LVL3 (GOWN DISPOSABLE) ×2
HANDPIECE INTERPULSE COAX TIP (DISPOSABLE) ×2
HOOD PEEL AWAY FLYTE STAYCOOL (MISCELLANEOUS) ×6 IMPLANT
IMMBOLIZER KNEE 19 BLUE UNIV (SOFTGOODS) ×3 IMPLANT
KIT RM TURNOVER STRD PROC AR (KITS) ×3 IMPLANT
KNEE MEDACTA TIBIAL/FEMORAL BL (Knees) ×3 IMPLANT
KNIFE SCULPS 14X20 (INSTRUMENTS) ×3 IMPLANT
MEDACTA IMPLANT
NDL SAFETY 18GX1.5 (NEEDLE) ×3 IMPLANT
NEEDLE SPNL 18GX3.5 QUINCKE PK (NEEDLE) ×3 IMPLANT
NEEDLE SPNL 20GX3.5 QUINCKE YW (NEEDLE) ×3 IMPLANT
NS IRRIG 1000ML POUR BTL (IV SOLUTION) ×3 IMPLANT
PACK TOTAL KNEE (MISCELLANEOUS) ×3 IMPLANT
PAD WRAPON POLAR KNEE (MISCELLANEOUS) ×1 IMPLANT
SET HNDPC FAN SPRY TIP SCT (DISPOSABLE) ×1 IMPLANT
SOL .9 NS 3000ML IRR  AL (IV SOLUTION) ×2
SOL .9 NS 3000ML IRR UROMATIC (IV SOLUTION) ×1 IMPLANT
STAPLER SKIN PROX 35W (STAPLE) ×3 IMPLANT
SUCTION FRAZIER HANDLE 10FR (MISCELLANEOUS) ×2
SUCTION TUBE FRAZIER 10FR DISP (MISCELLANEOUS) ×1 IMPLANT
SUT DVC 2 QUILL PDO  T11 36X36 (SUTURE) ×2
SUT DVC 2 QUILL PDO T11 36X36 (SUTURE) ×1 IMPLANT
SUT DVC QUILL MONODERM 30X30 (SUTURE) ×3 IMPLANT
SUT TICRON 2-0 30IN 311381 (SUTURE) ×3 IMPLANT
SYR 20CC LL (SYRINGE) ×3 IMPLANT
SYR 50ML LL SCALE MARK (SYRINGE) ×6 IMPLANT
TOWEL OR 17X26 4PK STRL BLUE (TOWEL DISPOSABLE) ×3 IMPLANT
TOWER CARTRIDGE SMART MIX (DISPOSABLE) ×3 IMPLANT
WRAPON POLAR PAD KNEE (MISCELLANEOUS) ×3

## 2016-03-05 NOTE — Anesthesia Preprocedure Evaluation (Addendum)
Anesthesia Evaluation  Patient identified by MRN, date of birth, ID band Patient awake    Reviewed: Allergy & Precautions, NPO status , Patient's Chart, lab work & pertinent test results, reviewed documented beta blocker date and time   History of Anesthesia Complications (+) PONV  Airway Mallampati: III  TM Distance: >3 FB     Dental  (+) Upper Dentures, Lower Dentures   Pulmonary Current Smoker,           Cardiovascular hypertension, Pt. on medications      Neuro/Psych    GI/Hepatic GERD  Controlled,  Endo/Other    Renal/GU      Musculoskeletal  (+) Arthritis ,   Abdominal   Peds  Hematology   Anesthesia Other Findings Cocaine negative. Panic attacks.  Reproductive/Obstetrics                           Anesthesia Physical Anesthesia Plan  ASA: III  Anesthesia Plan: Spinal   Post-op Pain Management:    Induction:   Airway Management Planned:   Additional Equipment:   Intra-op Plan:   Post-operative Plan:   Informed Consent: I have reviewed the patients History and Physical, chart, labs and discussed the procedure including the risks, benefits and alternatives for the proposed anesthesia with the patient or authorized representative who has indicated his/her understanding and acceptance.     Plan Discussed with: CRNA  Anesthesia Plan Comments:         Anesthesia Quick Evaluation

## 2016-03-05 NOTE — Anesthesia Procedure Notes (Signed)
Spinal  Patient location during procedure: OR Staffing Anesthesiologist: Gunnar Bulla Resident/CRNA: Rolla Plate Performed: resident/CRNA  Preanesthetic Checklist Completed: patient identified, site marked, surgical consent, pre-op evaluation, timeout performed, IV checked, risks and benefits discussed and monitors and equipment checked Spinal Block Patient position: sitting Prep: Betadine Patient monitoring: heart rate, continuous pulse ox, blood pressure and cardiac monitor Approach: midline Location: L3-4 Injection technique: single-shot Needle Needle type: Whitacre and Introducer  Needle gauge: 25 G Needle length: 12.7 cm Additional Notes Negative paresthesia. Negative blood return. Positive free-flowing CSF. Expiration date of kit checked and confirmed. Patient tolerated procedure well, without complications.

## 2016-03-05 NOTE — Progress Notes (Signed)
Patient resting between care. Pain medication given see mar. Patient asleep upon entering room. Patient states that dressing is to tight around knee, instructed patient that pain can be controlled with pain medication and repositioning. Patient refuses to wear bone foam and unwrapped dressing. Nursing reinforced. Polar care, and foot pumps intact.

## 2016-03-05 NOTE — Op Note (Signed)
03/05/2016  11:33 AM  PATIENT:  Katherine Hartman  65 y.o. female  PRE-OPERATIVE DIAGNOSIS:  Primary Osteoarthritis left knee  POST-OPERATIVE DIAGNOSIS:  Primary Osteoarthritis left knee  PROCEDURE:  Procedure(s): TOTAL KNEE ARTHROPLASTY (Left)  SURGEON: Laurene Footman, MD  ASSISTANTS: Rachel[facility]  ANESTHESIA:   spinal  EBL:  Total I/O In: 1500 [I.V.:1500] Out: 250 [Urine:150; Blood:100]  BLOOD ADMINISTERED:none  DRAINS: none   LOCAL MEDICATIONS USED:  MARCAINE    and OTHER Exparel, morphine  SPECIMEN:  Source of Specimen:  Cut ends of bone left knee  DISPOSITION OF SPECIMEN:  PATHOLOGY  COUNTS:  YES  TOURNIQUET:  * Missing tourniquet times found for documented tourniquets in log:  [id number] *31 minutes at 300 mmHg  IMPLANTS: Royal sphere system with 4 left femur, T3 I4 tibia with a short stem and 13 mm 4 insert, size 2 patella, all components cemented  DICTATION: .Dragon Dictation patient brought the operating room and after adequate spinal anesthesia was obtained, the left leg is prepped draped in usual sterile fashion. After patient identification and timeout procedures were completed, a midline skin incision was made followed by medial parapatellar arthrotomy. Inspection revealed extension eburnated bone in the lateral compartment and the lateral facet and trochlea of the femur medial compartment was relatively spared. There is also market synovitis. This was subsequently debrided. The fat pad and anterior cruciate ligament were excised and the proximal tibia exposed for application the Medacta cutting guide. Proximal tibia cut was carried out followed by the distal femoral cut using the Gilberts cutting guide. The 4-in-1 cutting guide was applied anterior posterior and chamfer cuts made. The proximal tibia was then exposed with excision of the posterior horns of the menisci and the size 3 tibia baseplate trial placed to cover the proximal tibia followed by pinning it in  place proximal tibia preparation with drill and reamer for short stem and keel punch placed. The 4 femur was then placed and a 13 mm insert gave good stability medially. Distal femoral drill holes were made followed by the notch cut in the trochlea. Patella was then cut using the patellar cutting guide drill holes made and sized to size 2. At this point the tourniquet was raised and the periarticular injection as noted above were given. After thoroughly irrigating and drying the bone the proximal tibia was exposed and tibia brought forward and the tibial baseplate was short stem inserted and excess cement removed followed by the 13 mm polyethylene component with set screw tightened with the torque screwdriver. Following this the femur was impacted into place and the knee held in extension as the cement set with excess cement removed and the patellar button clamped into place after the cemented set the knee was irrigated with dilute Betadine solution followed by pulse lavage the knee was slightly the valgus and there was tight lateral retinaculum so a lateral release was performed tourniquet let down to make sure there is no excessive bleeding. The arthrotomy was repaired using a heavy Tycron medially followed by the heavy Quill, 2-0 subcutaneous Vicryl suture followed by staples. Xeroform 4 x 4's web roll ABDs Polar Care and Ace wrap applied  PLAN OF CARE: Admit to inpatient   PATIENT DISPOSITION:  PACU - hemodynamically stable.

## 2016-03-05 NOTE — H&P (Signed)
Reviewed paper H+P, will be scanned into chart. No changes noted.  

## 2016-03-05 NOTE — Care Management Note (Signed)
Case Management Note  Patient Details  Name: Katherine Hartman MRN: [id number] Date of Birth: 1951-01-02  Subjective/Objective:      Spoke with patient for discharge planning. Patient had total knee today and is in pain at this time. Nurse present in room administering pain meds. Told patient that I would return later but she wanted me to stay in room. Patient stated that she lives home alone and has not made any arrangements for someone to stay with her at time of discharge. Patient stated ' I am hoping that I can go to rehab" she also said that she had gone to rehab the last time when she had her other knee done. She wants to go to [facility]. Patient stated that she has a rolling walker and bedside commode at home. Her pharmacy is [facility] on [address]. Will need PT to assess prior to firm DC plan. Explained to patient that she would need to work with PT tomorrow and see what their recommendations would be as far as going to Rehab. Patient expressed understanding.   Patient given list of [facility] providers. She stated that she has had [facility] in the past.    Continue to follow.        Action/Plan: To be determined.Awaiting PT evaluation, DOS  Expected Discharge Date:                  Expected Discharge Plan:  [place]  In-House Referral:     Discharge planning Services  CM Consult  Post Acute Care Choice:    Choice offered to:  Patient  DME Arranged:    DME Agency:     HH Arranged:    [place] Agency:     Status of Service:  In process, will continue to follow  If discussed at Long Length of Stay Meetings, dates discussed:    Additional Comments:  Alvie Heidelberg, RN 03/05/2016, 2:27 PM

## 2016-03-05 NOTE — Evaluation (Signed)
Physical Therapy Evaluation Patient Details Name: Katherine Hartman MRN: [id number] DOB: 15-Feb-1951 Today's Date: 03/05/2016   History of Present Illness  65 y/o female admitted for L TKA s/p promary localized OA of L knee. Pt has a history of smoking and HTN.  Clinical Impression  Katherine Hartman is a 65 y/o female seen by PT s/p L TKA. She presents with reported "15/10" Px on NPRS when PT entered. She was restless and reported the Px was constant and unbearable. Pain med were given before and during PT session. She was min assist with bed mobility, requiring only assistance to move her operated leg, and min assist with close guarding for transfer from bed to chair. Pt did not complete 10 SLRs, because of Px, but had adequate strength and did not require knee immobilizer. Pt was relatively compliant and cooperative given the amount of Px she was in, however ther-ex was stopped early when pt began crying uncontrollably. L knee AAROM was 14 to approx 60 degrees due mainly to difficulty activating quads and Px. Pt was able to partially weightbear on L LE in standing with no apparent increase in Px. Discussed post-op imaging results with RN and was assured that pt's surgeon is aware and that there is no change to the plan of care. Pt is appropriate for skilled PT at this time to address deficits in strength, balance, coordination, endurance, gait, pain, activity tolerance, and safe use of DME. Recommend SNF at this time.     Follow Up Recommendations SNF    Equipment Recommendations  None recommended by PT    Recommendations for Other Services       Precautions / Restrictions Precautions Precautions: Fall Restrictions Weight Bearing Restrictions: Yes Other Position/Activity Restrictions: R LE: WBAT      Mobility  Bed Mobility Overal bed mobility: Needs Assistance Bed Mobility: Supine to Sit     Supine to sit: Min assist     General bed mobility comments: Pt assisted with moving pt's L LE  off EOB, but otherwise pt was independent with trunk and UEs.   Transfers Overall transfer level: Needs assistance Equipment used: Rolling walker (2 wheeled) Transfers: Sit to/from Stand Sit to Stand: Min assist         General transfer comment: Pt is able to move from sit-to-stand at EOB with R LE and min assist with cuing for foot and hand placement.   Ambulation/Gait Ambulation/Gait assistance: Min assist Ambulation Distance (Feet): 3 Feet Assistive device: Rolling walker (2 wheeled) Gait Pattern/deviations: Step-to pattern;Decreased stride length;Antalgic;Trunk flexed     General Gait Details: Pt is severely antalgic and relies heavily on UEs to offload L LE. She was able to partial weight bear through operated leg in standing, but was affraid to do more.   Stairs            Wheelchair Mobility    Modified Rankin (Stroke Patients Only)       Balance Overall balance assessment: Needs assistance Sitting-balance support: Single extremity supported;Feet supported Sitting balance-Leahy Scale: Good Sitting balance - Comments: Pt has good trunk control and was able to sit indepentently    Standing balance support: Bilateral upper extremity supported Standing balance-Leahy Scale: Poor Standing balance comment: Pt required RW for standing balance due to unwillingness to fully weightbear on L LE at this time                             Pertinent  Vitals/Pain Pain Assessment: 0-10 Pain Score: 10-Worst pain ever Pain Location: operative site  Pain Descriptors / Indicators: Constant;Guarding;Grimacing;Discomfort;Crying;Moaning;Nagging;Operative site guarding;Restless Pain Intervention(s): Limited activity within patient's tolerance;Monitored during session;Premedicated before session;Repositioned;Patient requesting pain meds-RN notified;RN gave pain meds during session;Ice applied    Home Living Family/patient expects to be discharged to:: Private  residence Living Arrangements: Alone Available Help at Discharge: Family Type of Home: Apartment Home Access: Stairs to enter Entrance Stairs-Rails: None Entrance Stairs-Number of Steps: 1 Home Layout: One level Home Equipment: Bedside commode;Walker - 2 wheels      Prior Function Level of Independence: Independent         Comments: Pt reports indepenence with ADLs/IADLs     Hand Dominance   Dominant Hand: Right    Extremity/Trunk Assessment   Upper Extremity Assessment: Overall WFL for tasks assessed           Lower Extremity Assessment: LLE deficits/detail   LLE Deficits / Details: Pt has limited ability to activate quads and is having dificulty achieving full extension. Will assess further when Px in under control     Communication   Communication: No difficulties  Cognition Arousal/Alertness: Awake/alert Behavior During Therapy: Agitated;Anxious;Impulsive;Restless Overall Cognitive Status: Difficult to assess                      General Comments General comments (skin integrity, edema, etc.): Session required extended time due to pt's Px level necessitating nursing to enter twice to give meds and take vitals.    Exercises Total Joint Exercises Heel Slides: 5 reps;Left;Seated;AAROM (Pt began crying) Straight Leg Raises: 5 reps;Left;Seated;AAROM (partial range) Goniometric ROM: L knee AAROM: 14-60 degrees Other Exercises Other Exercises: Weight shifting in standing X 2 minutes      Assessment/Plan    PT Assessment Patient needs continued PT services  PT Diagnosis Difficulty walking;Abnormality of gait;Generalized weakness;Acute pain   PT Problem List Decreased strength;Decreased range of motion;Decreased activity tolerance;Decreased balance;Decreased mobility;Decreased coordination;Decreased knowledge of use of DME;Decreased safety awareness;Pain  PT Treatment Interventions DME instruction;Gait training;Stair training;Functional mobility  training;Therapeutic activities;Therapeutic exercise;Balance training;Neuromuscular re-education;Cognitive remediation   PT Goals (Current goals can be found in the Care Plan section) Acute Rehab PT Goals Patient Stated Goal: go to rehab PT Goal Formulation: With patient Time For Goal Achievement: 03/19/16 Potential to Achieve Goals: Good    Frequency BID   Barriers to discharge        Co-evaluation               End of Session Equipment Utilized During Treatment: Gait belt Activity Tolerance: Patient limited by pain;Treatment limited secondary to agitation Patient left: in chair;with chair alarm set;with call bell/phone within reach Nurse Communication: Mobility status;Patient requests pain meds         Time: [id number] PT Time Calculation (min) (ACUTE ONLY): 35 min   Charges:   PT Evaluation $PT Eval Moderate Complexity: 1 Procedure PT Treatments $Therapeutic Exercise: 8-22 mins   PT G Codes:        Aleatha Taite 19-Mar-2016, 5:07 PM  Burnett Corrente, SPT [phone]

## 2016-03-05 NOTE — Transfer of Care (Signed)
Immediate Anesthesia Transfer of Care Note  Patient: Katherine Hartman  Procedure(s) Performed: Procedure(s): TOTAL KNEE ARTHROPLASTY (Left)  Patient Location: PACU  Anesthesia Type:Spinal  Level of Consciousness: awake and alert   Airway & Oxygen Therapy: Patient Spontanous Breathing and Patient connected to face mask oxygen  Post-op Assessment: Report given to RN and Post -op Vital signs reviewed and stable  Post vital signs: Reviewed  Last Vitals:  Vitals:   03/05/16 0732 03/05/16 1125  BP: (!) 167/95 133/78  Pulse: 90 71  Resp: 17 15  Temp: (!) 35.7 C 36.7 C    Last Pain:  Vitals:   03/05/16 0732  TempSrc: Tympanic  PainSc:       Patients Stated Pain Goal: 1 ([id number])  Complications: No apparent anesthesia complications

## 2016-03-06 LAB — BASIC METABOLIC PANEL
ANION GAP: 9 (ref 5–15)
BUN: 15 mg/dL (ref 6–20)
CHLORIDE: 99 mmol/L — AB (ref 101–111)
CO2: 26 mmol/L (ref 22–32)
Calcium: 9 mg/dL (ref 8.9–10.3)
Creatinine, Ser: 0.84 mg/dL (ref 0.44–1.00)
GFR calc non Af Amer: >60 mL/min (ref >60)
Glucose, Bld: 111 mg/dL — ABNORMAL HIGH (ref 65–99)
Potassium: 3.9 mmol/L (ref 3.5–5.1)
Sodium: 134 mmol/L — ABNORMAL LOW (ref 135–145)

## 2016-03-06 LAB — CBC
HEMATOCRIT: 32 % — AB (ref 35.0–47.0)
HEMOGLOBIN: 10.9 g/dL — AB (ref 12.0–16.0)
MCH: 26.9 pg (ref 26.0–34.0)
MCHC: 33.9 g/dL (ref 32.0–36.0)
MCV: 79.3 fL — ABNORMAL LOW (ref 80.0–100.0)
Platelets: 279 10*3/uL (ref 150–440)
RBC: 4.04 MIL/uL (ref 3.80–5.20)
RDW: 16.4 % — ABNORMAL HIGH (ref 11.5–14.5)
WBC: 10.2 10*3/uL (ref 3.6–11.0)

## 2016-03-06 MED ORDER — OXYCODONE HCL ER 15 MG PO T12A
15.0000 mg | EXTENDED_RELEASE_TABLET | Freq: Two times a day (BID) | ORAL | Status: DC
  Administered 2016-03-06 – 2016-03-08 (×5): 15 mg via ORAL
  Filled 2016-03-06 (×5): qty 1

## 2016-03-06 NOTE — Progress Notes (Signed)
Pt extremely anxious complaining of pain. She has removed part of her surgical dressing and refuses to extend her leg. Pt has been medicated regularly MD is aware. At 1015 pt found sitting on the side of her bed with the polar care disconnected trying to get to the bathroom. Surgical dressing is saturated with blood. Removed the top portion and noticed the most proximal staple is oozing blood edges are aproximated. Reinforced made dr Rudene Christians aware. Educated pt of safety/ falls.

## 2016-03-06 NOTE — Progress Notes (Signed)
Patient request pain medication upon entering room patient sleep and snoring. Name called x2. No acute distress noted.

## 2016-03-06 NOTE — Addendum Note (Signed)
Addendum  created 03/06/16 0756 by Gunnar Bulla, MD   Anesthesia Attestations filed, Anesthesia Intra Blocks edited, Child order released for a procedure order, Sign clinical note

## 2016-03-06 NOTE — Clinical Social Work Placement (Signed)
   CLINICAL SOCIAL WORK PLACEMENT  NOTE  Date:  03/06/2016  Patient Details  Name: Katherine Hartman MRN: [id number] Date of Birth: [birth year]  Clinical Social Work is seeking post-discharge placement for this patient at the [place] level of care (*CSW will initial, date and re-position this form in  chart as items are completed):  Yes   Patient/family provided with [place] Work Department's list of facilities offering this level of care within the geographic area requested by the patient (or if unable, by the patient's family).  Yes   Patient/family informed of their freedom to choose among providers that offer the needed level of care, that participate in Medicare, Medicaid or managed care program needed by the patient, have an available bed and are willing to accept the patient.  Yes   Patient/family informed of [facility]'s ownership interest in [facility] and [facility], as well as of the fact that they are under no obligation to receive care at these facilities.  PASRR submitted to EDS on       PASRR number received on       Existing PASRR number confirmed on 03/06/16     FL2 transmitted to all facilities in geographic area requested by pt/family on 03/06/16     FL2 transmitted to all facilities within larger geographic area on       Patient informed that his/her managed care company has contracts with or will negotiate with certain facilities, including the following:            Patient/family informed of bed offers received.  Patient chooses bed at       Physician recommends and patient chooses bed at      Patient to be transferred to   on  .  Patient to be transferred to facility by       Patient family notified on   of transfer.  Name of family member notified:        PHYSICIAN       Additional Comment:    _______________________________________________ Fain Francis, Veronia Beets, LCSW 03/06/2016, 2:45 PM

## 2016-03-06 NOTE — Progress Notes (Signed)
Patient refuses to keep ext straight. Education given with no change. Bone foam remains off. Foley removed per patient request.

## 2016-03-06 NOTE — NC FL2 (Signed)
[place] LEVEL OF CARE SCREENING TOOL     IDENTIFICATION  Patient Name: Katherine Hartman Birthdate: 1950/10/28 Sex: female Admission Date (Current Location): 03/05/2016  [facility] and [place] Number:  Selena Lesser  ([id number]) Facility and Address:  [facility], [address], [place], [place] [address]      Provider Number: [id number]  Attending Physician Name and Address:  Hessie Knows, MD  Relative Name and Phone Number:       Current Level of Care: Hospital Recommended Level of Care: [place] Prior Approval Number:    Date Approved/Denied:   PASRR Number:  ( [id number] )  Discharge Plan: SNF    Current Diagnoses: Patient Active Problem List   Diagnosis Date Noted  . Primary localized osteoarthritis of left knee 03/05/2016  . Primary osteoarthritis of right knee 08/24/2015   Hyperlipidemia, unspecified    Hypertension    GERD (gastroesophageal reflux disease)       Orientation RESPIRATION BLADDER Height & Weight     Self, Time, Situation, Place  Normal Incontinent Weight: 219 lb (99.3 kg) Height:  5' 8.5" (174 cm)  BEHAVIORAL SYMPTOMS/MOOD NEUROLOGICAL BOWEL NUTRITION STATUS   (none )  (none ) Continent Diet (Diet: Regular )  AMBULATORY STATUS COMMUNICATION OF NEEDS Skin   Extensive Assist Verbally Surgical wounds (Incision: Left Knee )                       Personal Care Assistance Level of Assistance  Bathing, Feeding, Dressing Bathing Assistance: Limited assistance Feeding assistance: Independent Dressing Assistance: Limited assistance     Functional Limitations Info  Sight, Hearing, Speech Sight Info: Adequate Hearing Info: Adequate Speech Info: Adequate    SPECIAL CARE FACTORS FREQUENCY  PT (By licensed PT), OT (By licensed OT)     PT Frequency:  (5) OT Frequency:  (5)            Contractures      Additional Factors Info  Code Status, Allergies Code Status Info:  (Full  Code. ) Allergies Info:  (Ibuprofen )           Current Medications (03/06/2016):  This is the current hospital active medication list Current Facility-Administered Medications  Medication Dose Route Frequency Provider Last Rate Last Dose  . 0.9 %  sodium chloride infusion   Intravenous Continuous Hessie Knows, MD 75 mL/hr at 03/06/16 0426    . acetaminophen (TYLENOL) tablet 650 mg  650 mg Oral Q6H PRN Hessie Knows, MD       Or  . acetaminophen (TYLENOL) suppository 650 mg  650 mg Rectal Q6H PRN Hessie Knows, MD      . bisacodyl (DULCOLAX) EC tablet 5 mg  5 mg Oral Daily PRN Hessie Knows, MD      . bisoprolol-hydrochlorothiazide [facility]) 5-6.25 MG per tablet 1 tablet  1 tablet Oral Daily Hessie Knows, MD   1 tablet at 03/06/16 [phone]  . cyclobenzaprine (FLEXERIL) tablet 10 mg  10 mg Oral Q8H PRN Hessie Knows, MD   10 mg at 03/06/16 0835  . diphenhydrAMINE (BENADRYL) tablet 25 mg  25 mg Oral Daily PRN Hessie Knows, MD      . docusate sodium (COLACE) capsule 100 mg  100 mg Oral BID Hessie Knows, MD   100 mg at 03/06/16 0835  . enoxaparin (LOVENOX) injection 30 mg  30 mg Subcutaneous Q12H Hessie Knows, MD   30 mg at 03/06/16 0836  . magnesium citrate solution 1  Bottle  1 Bottle Oral Once PRN Hessie Knows, MD      . magnesium hydroxide (MILK OF MAGNESIA) suspension 30 mL  30 mL Oral Daily PRN Hessie Knows, MD      . menthol-cetylpyridinium (CEPACOL) lozenge 3 mg  1 lozenge Oral PRN Hessie Knows, MD       Or  . phenol (CHLORASEPTIC) mouth spray 1 spray  1 spray Mouth/Throat PRN Hessie Knows, MD      . metoCLOPramide (REGLAN) tablet 5-10 mg  5-10 mg Oral Q8H PRN Hessie Knows, MD       Or  . metoCLOPramide (REGLAN) injection 5-10 mg  5-10 mg Intravenous Q8H PRN Hessie Knows, MD      . morphine 2 MG/ML injection 2 mg  2 mg Intravenous Q1H PRN Hessie Knows, MD   2 mg at 03/06/16 0645  . multivitamin with minerals tablet 1 tablet  1 tablet Oral Daily Hessie Knows, MD   1 tablet at 03/06/16 [phone]  .  ondansetron (ZOFRAN) tablet 4 mg  4 mg Oral Q6H PRN Hessie Knows, MD       Or  . ondansetron [facility]) injection 4 mg  4 mg Intravenous Q6H PRN Hessie Knows, MD      . oxyCODONE (Oxy IR/ROXICODONE) immediate release tablet 5-10 mg  5-10 mg Oral Q3H PRN Hessie Knows, MD   10 mg at 03/06/16 1430  . oxyCODONE (OXYCONTIN) 12 hr tablet 15 mg  15 mg Oral Q12H Hessie Knows, MD   15 mg at 03/06/16 1012  . pantoprazole (PROTONIX) EC tablet 40 mg  40 mg Oral Daily Hessie Knows, MD   40 mg at 03/06/16 0835  . promethazine (PHENERGAN) tablet 25 mg  25 mg Oral Q6H PRN Hessie Knows, MD         Discharge Medications: Please see discharge summary for a list of discharge medications.  Relevant Imaging Results:  Relevant Lab Results:   Additional Information  (SSN: [id number])  Luisantonio Adinolfi, [place] Beets, LCSW

## 2016-03-06 NOTE — Progress Notes (Signed)
  Subjective: 1 Day Post-Op Procedure(s) (LRB): TOTAL KNEE ARTHROPLASTY (Left) Patient reports pain as severe.   Patient is well, but has had some minor complaints of lateral knee pain Denies any CP, SOB, ABD pain. We will continue therapy today.  Plan is to go Rehab after hospital stay.  Objective: Vital signs in last 24 hours: Temp:  [97.3 F (36.3 C)-99.2 F (37.3 C)] 98.6 F (37 C) (08/09 0357) Pulse Rate:  [62-88] 88 (08/09 0357) Resp:  [15-25] 18 (08/09 0357) BP: (133-182)/(69-98) 164/83 (08/09 0357) SpO2:  [98 %-100 %] 98 % (08/09 0357) FiO2 (%):  [21 %] 21 % (08/08 1240) Weight:  [99.3 kg (219 lb)] 99.3 kg (219 lb) (08/08 1418)  Intake/Output from previous day: 08/08 0701 - 08/09 0700 In: 3446.3 [P.O.:480; I.V.:2766.3; IV Piggyback:200] Out: 1375 [Urine:1275; Blood:100] Intake/Output this shift: No intake/output data recorded.   Recent Labs  03/05/16 1252 03/06/16 0348  HGB 11.1* 10.9*    Recent Labs  03/05/16 1252 03/06/16 0348  WBC 8.2 10.2  RBC 4.16 4.04  HCT 33.1* 32.0*  PLT 274 279    Recent Labs  03/05/16 1252 03/06/16 0348  NA  --  134*  K  --  3.9  CL  --  99*  CO2  --  26  BUN  --  15  CREATININE 0.96 0.84  GLUCOSE  --  111*  CALCIUM  --  9.0   No results for input(s): LABPT, INR in the last 72 hours.  EXAM General - Patient is Alert, Appropriate and Oriented Extremity - Neurovascular intact Sensation intact distally Intact pulses distally Dorsiflexion/Plantar flexion intact No cellulitis present Compartment soft Dressing - dressing C/D/I and no drainage Motor Function - intact, moving foot and toes well on exam.   Past Medical History:  Diagnosis Date  . Arthritis   . GERD (gastroesophageal reflux disease)   . Hypertension   . 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   Weight as of this encounter: 99.3 kg (219 lb). Advance diet Up with therapy  Recheck labs in the am CM to assist with discharge Add oxycontin XR to pain regimen  DVT Prophylaxis - Lovenox Weight-Bearing as tolerated to Left leg   T. Rachelle Hora, PA-C [place] 03/06/2016, 8:08 AM

## 2016-03-06 NOTE — Progress Notes (Signed)
Patient complaints of burning around left knee. Patient continues to remove surgical dressing. Patient refuses for nursing to reinforce dressing. Patient refuses to keep ext aligned.

## 2016-03-06 NOTE — Progress Notes (Signed)
Physical Therapy Treatment Patient Details Name: Katherine Hartman MRN: [id number] DOB: 1950-08-29 Today's Date: 03/06/2016    History of Present Illness 65 y/o female admitted for L TKA s/p promary localized OA of L knee. Pt has a history of smoking and HTN.    PT Comments    Pt is progressing very slowly towards goals. She is still experiencing severe Px. Participation and cooperation with PT is limited; she is compliant, but requires extra time due to Px and anxiety. She was able to perform some ther-ex and transfer to [facility] with mod assist and extensive cuing for hand and foot placement with close guarding due to Px and some aberrant movement when in stance phase on operative LE. L LE AAROM is 13.5-73 degrees. Pt is lacking about 20 degrees of extension during ambulation. She reports that this is due to her wound dressing and polar care being too tight; nurse notified. Pt is appropriate for continued PT at this time to address deficits in strength, balance, coordination, ROM, endurance, gait, and safe use of DME.    Follow Up Recommendations  SNF     Equipment Recommendations  None recommended by PT    Recommendations for Other Services       Precautions / Restrictions Precautions Precautions: Fall Restrictions Weight Bearing Restrictions: Yes LLE Weight Bearing: Weight bearing as tolerated    Mobility  Bed Mobility Overal bed mobility: Needs Assistance Bed Mobility: Supine to Sit     Supine to sit: Min assist     General bed mobility comments: Pt assisted with moving pt's L LE off EOB, but otherwise pt was independent with trunk and UEs.   Transfers Overall transfer level: Needs assistance Equipment used: Rolling walker (2 wheeled) Transfers: Sit to/from Stand Sit to Stand: Mod assist         General transfer comment: Pt is able to move from sit-to-stand at EOB with R LE and mod assist with cuing for foot and hand placement   Ambulation/Gait Ambulation/Gait  assistance: Mod assist Ambulation Distance (Feet): 6 Feet Assistive device: Rolling walker (2 wheeled) Gait Pattern/deviations: Step-to pattern;Decreased stride length;Antalgic;Trunk flexed   Gait velocity interpretation: <1.8 ft/sec, indicative of risk for recurrent falls General Gait Details: Pt is severely antalgic and relies heavily on UEs to offload L LE. She was able to partial weight bear through operated leg in standing, but was affraid to do more. Pt is lacking approx 20 degrees extension with ambulation   Stairs            Wheelchair Mobility    Modified Rankin (Stroke Patients Only)       Balance Overall balance assessment: Needs assistance Sitting-balance support: Single extremity supported Sitting balance-Leahy Scale: Good Sitting balance - Comments: Pt has good trunk control and was able to sit indepentently    Standing balance support: Bilateral upper extremity supported Standing balance-Leahy Scale: Poor Standing balance comment: Pt required RW for standing balance due to unwillingness to fully weightbear on L LE at this time. Pt toes out with R LE in stance                    Cognition Arousal/Alertness: Awake/alert Behavior During Therapy: Agitated;Anxious;Impulsive;Restless Overall Cognitive Status: Difficult to assess                      Exercises Total Joint Exercises Ankle Circles/Pumps: 10 reps;Both;Supine (Supervision with verbal and tactile cuing) Quad Sets: 10 reps;Both (min assist with verbal  and tactile cuing) Heel Slides: 5 reps;Left;Seated;AAROM (min assist with verbal and tactile cuing) Goniometric ROM: L knee AAROM 13-73 degrees Other Exercises Other Exercises: Weight shifting in standing X 2 minutes Other Exercises: Therapeutic activity including transfer to bedside commode with mod assist and verbal cuing for use of walker in transfer and ambulation, cuing for gait, and for hand placement X 25 minutes. Pt required  reassurance due to high level of fear/anxiety about weightbearing on L LE    General Comments General comments (skin integrity, edema, etc.): Pt required longer than usual to perform ther-ex and activity training due to Px and anxiety.      Pertinent Vitals/Pain Pain Assessment: Faces Faces Pain Scale: Hurts whole lot Pain Location: operative site Pain Descriptors / Indicators: Crying;Discomfort;Grimacing;Moaning;Operative site guarding Pain Intervention(s): Limited activity within patient's tolerance;Monitored during session;Premedicated before session;Repositioned;Ice applied    Home Living                      Prior Function            PT Goals (current goals can now be found in the care plan section) Acute Rehab PT Goals Patient Stated Goal: go to rehab PT Goal Formulation: With patient Time For Goal Achievement: 03/19/16 Potential to Achieve Goals: Good Progress towards PT goals: Progressing toward goals    Frequency  BID    PT Plan Current plan remains appropriate    Co-evaluation             End of Session Equipment Utilized During Treatment: Gait belt Activity Tolerance: Patient limited by pain;Treatment limited secondary to agitation Patient left: in bed;with call bell/phone within reach;with bed alarm set;with SCD's reapplied     Time: 1024-1105 PT Time Calculation (min) (ACUTE ONLY): 41 min  Charges:  $Gait Training: 8-22 mins $Therapeutic Exercise: 8-22 mins $Therapeutic Activity: 8-22 mins                    G Codes:      Hewitt Garner 2016/03/31, 12:49 PM  Burnett Corrente, SPT [phone]

## 2016-03-06 NOTE — Evaluation (Signed)
Occupational Therapy Evaluation Patient Details Name: Katherine Hartman MRN: [id number] DOB: 03-21-1951 Today's Date: 03/06/2016    History of Present Illness 65 y/o female admitted for L TKA s/p primary localized OA of L knee. Pt has a history of smoking and HTN.   Clinical Impression   Pt is 65 year old s/p L TKA.  She has a reacher and sock aid at home and was using prior to surgery as needed.  She currently requires mod assist and cues for LB dressing due to pain and fear of falling when standing.  She required mod assist for transfer on and off BSC and independent with hygiene.  She has a fear of falling using FWW and requires mod cues for mobility back to EOB to lie down in bed again with ice reapplied with Bone Foam in place. Rec continued OT for ADl retarining with AD for LB dressing and functional mobility training to increase confidence using FWW during ADLs.  Rec SNF after discharge to continue rehab.    Follow Up Recommendations  SNF    Equipment Recommendations       Recommendations for Other Services       Precautions / Restrictions Precautions Precautions: Fall Restrictions Weight Bearing Restrictions: Yes LLE Weight Bearing: Weight bearing as tolerated      Mobility Bed Mobility                  Transfers                      Balance                                            ADL Overall ADL's : Needs assistance/impaired                                       General ADL Comments: Pt has a reacher and sock aid at home and was using prior to surgery as needed.  She currently requires mod assist and cues for LB dressing due to pain and fear of falling when standing.  She required mod assist for transfer on and off BSC and independent with hygiene.  She has a fear of falling using FWW and requires mod cues for mobility back to EOB to lie down in bed again with ice reapplied with Bone Foam in place.        Vision     Perception     Praxis      Pertinent Vitals/Pain Pain Assessment: Faces Faces Pain Scale: Hurts whole lot Pain Location: L knee Pain Descriptors / Indicators: Aching;Constant;Operative site guarding Pain Intervention(s): Limited activity within patient's tolerance;Monitored during session;Premedicated before session;Repositioned;Ice applied     Hand Dominance Right   Extremity/Trunk Assessment Upper Extremity Assessment Upper Extremity Assessment: Overall WFL for tasks assessed   Lower Extremity Assessment Lower Extremity Assessment: Defer to PT evaluation       Communication Communication Communication: No difficulties   Cognition Arousal/Alertness: Awake/alert Behavior During Therapy: Anxious;Impulsive;Restless Overall Cognitive Status: Difficult to assess                     General Comments       Exercises       Shoulder Instructions  Home Living Family/patient expects to be discharged to:: Private residence Living Arrangements: Alone Available Help at Discharge: Family Type of Home: Apartment Home Access: Stairs to enter Technical brewer of Steps: 1 Entrance Stairs-Rails: None Home Layout: One level     Bathroom Shower/Tub: Tub/shower unit Shower/tub characteristics: Architectural technologist: Standard Bathroom Accessibility: Yes How Accessible: Accessible via walker Home Equipment: Bedside commode;Walker - 2 wheels          Prior Functioning/Environment Level of Independence: Independent             OT Diagnosis: Generalized weakness;Acute pain   OT Problem List: Decreased strength;Decreased range of motion;Decreased activity tolerance;Pain   OT Treatment/Interventions: Self-care/ADL training;Patient/family education;Therapeutic activities;DME and/or AE instruction    OT Goals(Current goals can be found in the care plan section) Acute Rehab OT Goals Patient Stated Goal: go to rehab OT Goal  Formulation: With patient Time For Goal Achievement: 03/20/16 Potential to Achieve Goals: Good ADL Goals Pt Will Perform Lower Body Dressing: with set-up;with supervision;with adaptive equipment;sit to/from stand (sitting in chair) Pt Will Transfer to Toilet: with set-up;with min assist;bedside commode;stand pivot transfer (with no LOB using FWW)  OT Frequency: Min 1X/week   Barriers to D/C:            Co-evaluation              End of Session Equipment Utilized During Treatment: Gait belt Nurse Communication:  (NSG udpated that pt used BSC and urinated)  Activity Tolerance: Patient limited by pain Patient left: in bed;with call bell/phone within reach;with bed alarm set   Time: [id number] OT Time Calculation (min): 35 min Charges:  OT General Charges $OT Visit: 1 Procedure OT Evaluation $OT Eval Moderate Complexity: 1 Procedure OT Treatments $Self Care/Home Management : 8-22 mins G-Codes:     Chrys Racer, OTR/L ascom [phone] 03/06/16, 5:09 PM

## 2016-03-06 NOTE — Clinical Social Work Note (Signed)
Clinical Social Work Assessment  Patient Details  Name: Katherine Hartman MRN: [id number] Date of Birth: 1951-06-07  Date of referral:  03/06/16               Reason for consult:  Facility Placement                Permission sought to share information with:  Facility Art therapist granted to share information::  Yes, Verbal Permission Granted  Name::        Agency::  SNF admissions  Relationship::     Contact Information:     Housing/Transportation Living arrangements for the past 2 months:  Single Family Home Source of Information:  Patient Patient Interpreter Needed:  None Criminal Activity/Legal Involvement Pertinent to Current Situation/Hospitalization:  No - Comment as needed Significant Relationships:  Other Family Members, Adult Children Lives with:  Self Do you feel safe going back to the place where you live?  No (Patient feels she need some short term rehab before she is able to go back home.) Need for family participation in patient care:  No (Coment)  Care giving concerns:  Patient feels she needs some short term rehab before she is able to return back home.   Social Worker assessment / plan:  Patient is a pleasant 65 year old female who is alert and oriented x4 and able to express her feelings.  Patient lives alone and does not have any family available to stay with her.  Patient states she has been to rehab in the past and was pleased with the care that was provided.  Patient was informed about how insurance will pay for her stay and what will be covered.  Patient states she would like to return to [facility] if they have a bed available.  Patient expressed that she did not have any other questions and will need EMS transport to get to SNF.  Patient gave MSW permission to begin bed search process in [facility]  Employment status:  Retired, Disabled (Comment on whether or not currently receiving Disability) Insurance information:   Medicare, Medicaid [place] PT Recommendations:  [place] / Referral to community resources:  [place]  Patient/Family's Response to care:  Patient in agreement to going to SNF for short term rehab.  Patient/Family's Understanding of and Emotional Response to Diagnosis, Current Treatment, and Prognosis:  Patient expressed that she feels like she is in more pain this time compared to last time she had surgery.  Patient stated she hopes she will heal quickly so she can return back home.  Emotional Assessment Appearance:  Appears stated age Attitude/Demeanor/Rapport:    Affect (typically observed):  Appropriate, Calm Orientation:  Oriented to Self, Oriented to Place, Oriented to  Time, Oriented to Situation Alcohol / Substance use:  Not Applicable Psych involvement (Current and /or in the community):  No (Comment)  Discharge Needs  Concerns to be addressed:    Readmission within the last 30 days:  No Current discharge risk:  Lives alone, Lack of support system Barriers to Discharge:  Continued Medical Work up   Ross Ludwig 03/06/2016, 2:53 PM

## 2016-03-06 NOTE — Progress Notes (Signed)
Physical Therapy Treatment Patient Details Name: Katherine Hartman MRN: [id number] DOB: 11-13-1950 Today's Date: 03/06/2016    History of Present Illness 65 y/o female admitted for L TKA s/p primary localized OA of L knee. Pt has a history of smoking and HTN.    PT Comments    Pt is progressing towards goals. She was able to participate in ther-ex and ambulate 79' today with min assist and chair follow. Pt still requires extensive cuing for gait pattern and safe use of RW. Pt is still lacking terminal extension on L with ambulation despite verbal and tactile cuing. PT tech was utilized for chair follow and pt was rolled back to her room in the chair after ambulating. She reported that her Px is worse than child birth, nurse notified pt is requesting meds. Pt is appropriate for continued PT at this time to address deficits in strength, balance, coordination, endurance, gait, and safe use of DME.    Follow Up Recommendations  SNF     Equipment Recommendations  None recommended by PT    Recommendations for Other Services       Precautions / Restrictions Precautions Precautions: Fall Restrictions Weight Bearing Restrictions: Yes LLE Weight Bearing: Weight bearing as tolerated    Mobility  Bed Mobility Overal bed mobility: Needs Assistance Bed Mobility: Supine to Sit     Supine to sit: Min assist     General bed mobility comments: Pt assisted with moving pt's L LE off EOB, but otherwise pt was independent with trunk and UEs.   Transfers Overall transfer level: Needs assistance Equipment used: Rolling walker (2 wheeled) Transfers: Sit to/from Stand Sit to Stand: +2 physical assistance;+2 safety/equipment;Min assist         General transfer comment: Pt is able to move from sit-to-stand at EOB with R LE and mod assist with cuing for foot and hand placement. Today she had +2 for assist due to PT aid being present for chair follow. She has not gotten any  worse  Ambulation/Gait Ambulation/Gait assistance: Min assist;+2 safety/equipment Ambulation Distance (Feet): 35 Feet Assistive device: Rolling walker (2 wheeled) Gait Pattern/deviations: Step-to pattern;Decreased stride length;Antalgic;Trunk flexed   Gait velocity interpretation: <1.8 ft/sec, indicative of risk for recurrent falls General Gait Details: Pt is antalgic and relies heavily on UEs to offload L LE. She was able to partial weight bear through operated leg with ambulation with no increase in Px. Pt is still lacking terminal extension with ambulation. Required cuing for foot strike; pt prefers to walk with only toe touch on operative side    Stairs            Wheelchair Mobility    Modified Rankin (Stroke Patients Only)       Balance Overall balance assessment: Needs assistance Sitting-balance support: Single extremity supported Sitting balance-Leahy Scale: Good Sitting balance - Comments: Pt has good trunk control and was able to sit indepentently    Standing balance support: Bilateral upper extremity supported Standing balance-Leahy Scale: Poor Standing balance comment: Pt required RW for standing balance due to unwillingness to fully weightbear on L LE at this time. Pt toes out with R LE in stance                    Cognition Arousal/Alertness: Awake/alert Behavior During Therapy: Anxious;Impulsive;Restless Overall Cognitive Status: Within Functional Limits for tasks assessed                      Exercises  Total Joint Exercises Ankle Circles/Pumps: Other reps (comment);Both;Strengthening;Seated (12 with manual resistance and verbal cuing) Quad Sets: Other reps (comment);Left;Seated (12 with min assist ) Hip ABduction/ADduction: Other reps (comment);Left;AROM;Seated (12 mist min assist)    General Comments        Pertinent Vitals/Pain Pain Assessment: 0-10 Pain Score: 10-Worst pain ever Faces Pain Scale: Hurts worst Pain Location:  operative site Pain Descriptors / Indicators: Crying;Discomfort;Grimacing;Guarding Pain Intervention(s): Limited activity within patient's tolerance;Monitored during session;Premedicated before session;Patient requesting pain meds-RN notified;Ice applied    Home Living Family/patient expects to be discharged to:: Private residence Living Arrangements: Alone Available Help at Discharge: Family Type of Home: Apartment Home Access: Stairs to enter Entrance Stairs-Rails: None Home Layout: One level Home Equipment: Bedside commode;Walker - 2 wheels      Prior Function Level of Independence: Independent          PT Goals (current goals can now be found in the care plan section) Acute Rehab PT Goals Patient Stated Goal: go to rehab PT Goal Formulation: With patient Time For Goal Achievement: 03/19/16 Potential to Achieve Goals: Good Progress towards PT goals: Progressing toward goals    Frequency  BID    PT Plan Current plan remains appropriate    Co-evaluation             End of Session Equipment Utilized During Treatment: Gait belt Activity Tolerance: Patient tolerated treatment well Patient left: in chair;with call bell/phone within reach;with chair alarm set     Time: 1349-1419 PT Time Calculation (min) (ACUTE ONLY): 30 min  Charges:  $Gait Training: 8-22 mins $Therapeutic Exercise: 8-22 mins                    G Codes:      Eugenia Eldredge Apr 05, 2016, 5:56 PM  Burnett Corrente, SPT [phone]

## 2016-03-06 NOTE — Anesthesia Procedure Notes (Signed)
Spinal  Patient location during procedure: OR Staffing Anesthesiologist: Bethaney Oshana Performed: anesthesiologist  Preanesthetic Checklist Completed: patient identified, site marked, surgical consent, pre-op evaluation, timeout performed, IV checked and risks and benefits discussed Spinal Block Patient position: sitting Prep: Betadine Patient monitoring: heart rate, cardiac monitor, continuous pulse ox and blood pressure Approach: midline Location: L3-4 Injection technique: single-shot Needle Needle type: Pencil-Tip  Needle gauge: 25 G Needle length: 9 cm Assessment Sensory level: T10     

## 2016-03-06 NOTE — Progress Notes (Signed)
Pt removed her surgical dressing completely. Scant amount of bleeding noted applied honeycomb and instructed the patient once again not to touch or remove dressing.

## 2016-03-06 NOTE — Anesthesia Postprocedure Evaluation (Signed)
Anesthesia Post Note  Patient: Katherine Hartman  Procedure(s) Performed: Procedure(s) (LRB): TOTAL KNEE ARTHROPLASTY (Left)  Patient location during evaluation: Nursing Unit Anesthesia Type: Spinal Level of consciousness: awake and alert and oriented Pain management: satisfactory to patient Vital Signs Assessment: post-procedure vital signs reviewed and stable Respiratory status: spontaneous breathing and respiratory function stable Cardiovascular status: blood pressure returned to baseline and stable Postop Assessment: no headache, no backache, spinal receding, no signs of nausea or vomiting, adequate PO intake and patient able to bend at knees Anesthetic complications: no    Last Vitals:  Vitals:   03/05/16 2341 03/06/16 0357  BP: (!) 182/74 (!) 164/83  Pulse: 84 88  Resp: 18 18  Temp: 37 C 37 C    Last Pain:  Vitals:   03/06/16 0526  TempSrc:   PainSc: 3                  Blima Singer

## 2016-03-07 LAB — SURGICAL PATHOLOGY

## 2016-03-07 LAB — BASIC METABOLIC PANEL
ANION GAP: 9 (ref 5–15)
BUN: 16 mg/dL (ref 6–20)
CHLORIDE: 96 mmol/L — AB (ref 101–111)
CO2: 27 mmol/L (ref 22–32)
Calcium: 9 mg/dL (ref 8.9–10.3)
Creatinine, Ser: 0.99 mg/dL (ref 0.44–1.00)
GFR calc Af Amer: >60 mL/min (ref >60)
GFR calc non Af Amer: 58 mL/min — ABNORMAL LOW (ref >60)
GLUCOSE: 93 mg/dL (ref 65–99)
POTASSIUM: 3.5 mmol/L (ref 3.5–5.1)
Sodium: 132 mmol/L — ABNORMAL LOW (ref 135–145)

## 2016-03-07 LAB — CBC
HEMATOCRIT: 34.2 % — AB (ref 35.0–47.0)
HEMOGLOBIN: 11.4 g/dL — AB (ref 12.0–16.0)
MCH: 26.8 pg (ref 26.0–34.0)
MCHC: 33.4 g/dL (ref 32.0–36.0)
MCV: 80.3 fL (ref 80.0–100.0)
PLATELETS: 266 10*3/uL (ref 150–440)
RBC: 4.26 MIL/uL (ref 3.80–5.20)
RDW: 16.7 % — ABNORMAL HIGH (ref 11.5–14.5)
WBC: 12 10*3/uL — AB (ref 3.6–11.0)

## 2016-03-07 MED ORDER — OXYCODONE HCL ER 15 MG PO T12A: 15.0000 mg | EXTENDED_RELEASE_TABLET | Freq: Two times a day (BID) | ORAL | 0 refills | Status: DC

## 2016-03-07 MED ORDER — OXYCODONE HCL 5 MG PO TABS: 5.0000 mg | ORAL_TABLET | ORAL | 0 refills | Status: DC | PRN

## 2016-03-07 MED ORDER — ENOXAPARIN SODIUM 40 MG/0.4ML ~~LOC~~ SOLN: 40.0000 mg | SUBCUTANEOUS | 0 refills | Status: AC

## 2016-03-07 NOTE — Progress Notes (Signed)
Physical Therapy Treatment Patient Details Name: Katherine Hartman MRN: [id number] DOB: 12/03/1950 Today's Date: 03/07/2016    History of Present Illness 65 y/o female admitted for L TKA s/p primary localized OA of L knee. Pt has a history of smoking and HTN.    PT Comments    Pt made good progress towards goals today. She reports her Px has decreased to 7-8/10, but her affect has improved significantly. Her gait has improved with a step-through-like pattern. She is still very antalgic and relies heavily on UEs via RW. She ambulated 63' with improved motivation. She is still lacking significant terminal extension with ambulation; AAROM on L LE was 12-82 degrees. Ther-ex booklet was given to pt and explained; pt expressed understanding. Ther-ex was stopped early due to pt's breakfast being delivered and pt refusing to continue. Pt was otherwise reasonably compliant. Pt is appropriate for continued PT at this time to address deficits in strength, balance, coordination, endurance, gait, and safe use of DME.     Follow Up Recommendations  SNF     Equipment Recommendations  None recommended by PT    Recommendations for Other Services       Precautions / Restrictions Precautions Precautions: Fall Precaution Comments: Still lacking terminal extention on L and fatigues rapidly Restrictions Weight Bearing Restrictions: Yes LLE Weight Bearing: Weight bearing as tolerated    Mobility  Bed Mobility Overal bed mobility: Needs Assistance Bed Mobility: Supine to Sit     Supine to sit: Min assist     General bed mobility comments: Pt assisted with moving pt's L LE off EOB, but otherwise pt was independent with trunk and UEs.   Transfers Overall transfer level: Needs assistance Equipment used: Rolling walker (2 wheeled) Transfers: Sit to/from Stand Sit to Stand: Min assist         General transfer comment: Pt is able to move from sit-to-stand at EOB with R LE and min-mod assist with  extensive cuing for foot and hand placement for safe technique  Ambulation/Gait Ambulation/Gait assistance: Min assist Ambulation Distance (Feet): 60 Feet Assistive device: Rolling walker (2 wheeled) Gait Pattern/deviations: Step-through pattern;Decreased stride length;Antalgic;Trunk flexed   Gait velocity interpretation: <1.8 ft/sec, indicative of risk for recurrent falls General Gait Details: Pt is antalgic and relies heavily on UEs to offload L LE. She was able to tolerate >50% of her weight on L LE in quiet standing. Gait has progressed to a step-through-like pattern with extensive cuing   Stairs            Wheelchair Mobility    Modified Rankin (Stroke Patients Only)       Balance Overall balance assessment: Needs assistance Sitting-balance support: Single extremity supported Sitting balance-Leahy Scale: Good Sitting balance - Comments: Pt has good trunk control and was able to sit indepentently    Standing balance support: Bilateral upper extremity supported Standing balance-Leahy Scale: Poor Standing balance comment: Pt required RW for standing balance. She is antalgic on L and has moderate anxiety about falling                    Cognition Arousal/Alertness: Awake/alert Behavior During Therapy: Anxious;Impulsive Overall Cognitive Status: Within Functional Limits for tasks assessed                      Exercises Total Joint Exercises Ankle Circles/Pumps: 10 reps;Both;AROM;Seated (with supervision) Quad Sets: 15 reps;Left;AROM;Seated (with min assist and tactile cuing) Goniometric ROM: L knee AAROM 12-82 degrees Marching  in Standing: 10 reps;Both;Standing (With min assist, RW, and verbal cuing)    General Comments General comments (skin integrity, edema, etc.): Minor bleeding through stitches during ambulation      Pertinent Vitals/Pain Pain Assessment: 0-10 Pain Score: 8  Faces Pain Scale: Hurts little more Pain Location: operative site  and posterior knee Pain Descriptors / Indicators: Guarding;Grimacing;Discomfort;Pressure Pain Intervention(s): Limited activity within patient's tolerance;Monitored during session;Premedicated before session;Repositioned;Ice applied    Home Living                      Prior Function            PT Goals (current goals can now be found in the care plan section) Acute Rehab PT Goals Patient Stated Goal: go to rehab PT Goal Formulation: With patient Time For Goal Achievement: 03/19/16 Potential to Achieve Goals: Good Progress towards PT goals: Progressing toward goals    Frequency  BID    PT Plan Current plan remains appropriate    Co-evaluation             End of Session Equipment Utilized During Treatment: Gait belt Activity Tolerance: Patient tolerated treatment well Patient left: in bed;with call bell/phone within reach;with bed alarm set;with SCD's reapplied     Time: [id number] PT Time Calculation (min) (ACUTE ONLY): 31 min  Charges:  $Gait Training: 8-22 mins $Therapeutic Exercise: 8-22 mins                    G Codes:      Coreon Simkins 03/17/16, 9:48 AM  Burnett Corrente, SPT [phone]

## 2016-03-07 NOTE — Discharge Instructions (Signed)

## 2016-03-07 NOTE — Progress Notes (Signed)
Shift assessment completed at 0800. Pt rating pain at 8/10 at that time and received pain medication and flexeril po. At time of assessment, staff discussed with pt the need to comply with ordered teds, pt's l leg was remeasured and teds placed bilat. Pt also had polar care on her L knee. Honeycomb dressing was intact with small area of sanguinous drainage noted, this has not increased in size this shift. Incision with tegaderm and staples, approximated. Since assessment, pt has called staff for pain meds on a regular basis, requesting meds q3h approximately. Physical therapy has worked with pt twice this shift, pt tolerated well. Pt has tolerated scd's for her feet for several hours at at time, pt was educated by staff regarding prophylactic measures for blood clots, and possible consequences to refusing interventions to prevent these. Pt complained about the PIV #22 in her L hand, and this was eventually removed due to pt's many complaints, site was free of discoloration and swelling. Pulses to bilat feet are strong, pt is able to flex her feet fully. At this time, pt is resting in bed and watching tv, uses walker when toileting. Pt has phone in reach.

## 2016-03-07 NOTE — Progress Notes (Signed)
   Subjective: 2 Days Post-Op Procedure(s) (LRB): TOTAL KNEE ARTHROPLASTY (Left) Patient reports pain as moderate.   Patient is well, and has had no acute complaints or problems Denies any CP, SOB, ABD pain. We will continue therapy today.  Plan is to go Skilled nursing facility after hospital stay.  Objective: Vital signs in last 24 hours: Temp:  [98.2 F (36.8 C)-98.8 F (37.1 C)] 98.6 F (37 C) (08/10 0447) Pulse Rate:  [27-98] 87 (08/10 0447) Resp:  [16-20] 18 (08/10 0447) BP: (136-182)/(62-94) 136/62 (08/10 0447) SpO2:  [96 %-100 %] 99 % (08/10 0447)  Intake/Output from previous day: 08/09 0701 - 08/10 0700 In: 120 [P.O.:120] Out: 201 [Urine:201] Intake/Output this shift: No intake/output data recorded.   Recent Labs  03/05/16 1252 03/06/16 0348 03/07/16 0413  HGB 11.1* 10.9* 11.4*    Recent Labs  03/06/16 0348 03/07/16 0413  WBC 10.2 12.0*  RBC 4.04 4.26  HCT 32.0* 34.2*  PLT 279 266    Recent Labs  03/06/16 0348 03/07/16 0413  NA 134* 132*  K 3.9 3.5  CL 99* 96*  CO2 26 27  BUN 15 16  CREATININE 0.84 0.99  GLUCOSE 111* 93  CALCIUM 9.0 9.0   No results for input(s): LABPT, INR in the last 72 hours.  EXAM General - Patient is Alert, Appropriate and Oriented Extremity - Neurovascular intact Sensation intact distally Intact pulses distally Dorsiflexion/Plantar flexion intact No cellulitis present Compartment soft Dressing - dressing C/D/I and scant drainage Motor Function - intact, moving foot and toes well on exam.   Past Medical History:  Diagnosis Date  . Arthritis   . GERD (gastroesophageal reflux disease)   . Hypertension   . 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   Weight as of this encounter: 99.3 kg (219 lb). Advance diet Up with therapy  Needs BM Plan on discharge to SNF tomorrow Hgb trending up, Labs and VSS.   DVT Prophylaxis - Lovenox, Foot Pumps and TED hose Weight-Bearing as tolerated to left leg   T. Rachelle Hora, PA-C [place] 03/07/2016, 7:29 AM

## 2016-03-07 NOTE — Progress Notes (Signed)
Physical Therapy Treatment Patient Details Name: Katherine Hartman MRN: [id number] DOB: 1950/09/30 Today's Date: 03/07/2016    History of Present Illness 65 y/o female admitted for L TKA s/p primary localized OA of L knee. Pt has a history of smoking and HTN.    PT Comments    Pt is progressing towards goals. She walked 70 feet with slightly improved gait mechanics, although terminal extension is still limited, and experienced several freezing episode when given verbal and tactile cues for more reciprocal gait pattern; pt is still having periodic episodes of fear and anxiety with ambulation, but overall she is steadily improving; tolerated more ther-ex, but concerned about difficulty with volitional quad activation on L. Pt is appropriate for continued PT at this time to address deficits in strength, balance, coordination, endurance, gait, and safe use of DME.    Follow Up Recommendations  SNF     Equipment Recommendations  None recommended by PT    Recommendations for Other Services       Precautions / Restrictions Precautions Precautions: Fall Precaution Comments: Still lacking terminal extention on L and fatigues rapidly Restrictions Weight Bearing Restrictions: Yes LLE Weight Bearing: Weight bearing as tolerated    Mobility  Bed Mobility Overal bed mobility: Needs Assistance Bed Mobility: Supine to Sit     Supine to sit: Min assist     General bed mobility comments: Pt assisted with moving pt's L LE off EOB, but otherwise pt was independent with trunk and UEs.   Transfers Overall transfer level: Needs assistance Equipment used: Rolling walker (2 wheeled) Transfers: Sit to/from Stand Sit to Stand: Min assist         General transfer comment: Pt is able to move from sit-to-stand at EOB with R LE and min assist with extensive cuing for foot and hand placement for safe technique  Ambulation/Gait Ambulation/Gait assistance: Min assist Ambulation Distance (Feet): 70  Feet Assistive device: Rolling walker (2 wheeled) Gait Pattern/deviations: Step-through pattern;Decreased stride length;Antalgic;Trunk flexed   Gait velocity interpretation: <1.8 ft/sec, indicative of risk for recurrent falls General Gait Details: Pt is antalgic and relies heavily on UEs to offload L LE. She was able to tolerate >50% of her weight on L LE in quiet standing. Gait has progressed to a step-through-like pattern with extensive cuing. Intructions to progress to more reicprocal gait pt got nervous and experienced freezing episodes; reverted back to pseudo-reciprocal gait   Stairs            Wheelchair Mobility    Modified Rankin (Stroke Patients Only)       Balance Overall balance assessment: Needs assistance Sitting-balance support: Single extremity supported Sitting balance-Leahy Scale: Good Sitting balance - Comments: Pt has good trunk control and was able to sit indepentently    Standing balance support: Bilateral upper extremity supported Standing balance-Leahy Scale: Poor Standing balance comment: Pt required RW for standing balance. She is antalgic on L and has moderate anxiety about falling.                     Cognition Arousal/Alertness: Awake/alert Behavior During Therapy: Anxious;Impulsive Overall Cognitive Status: Within Functional Limits for tasks assessed                      Exercises Total Joint Exercises Ankle Circles/Pumps: 20 reps;Both;Strengthening;Seated Quad Sets: 20 reps;Left;AAROM;Seated Short Arc Quad: 5 reps;Left;AAROM;Seated Straight Leg Raises: Other reps (comment);Left;AAROM (8) Marching in Standing: 10 reps;Both;Standing    General Comments  Pertinent Vitals/Pain Pain Score: 7  Pain Location: operative site Pain Descriptors / Indicators: Discomfort;Crying;Grimacing;Guarding Pain Intervention(s): Limited activity within patient's tolerance;Monitored during session;Premedicated before session;Ice  applied    Home Living                      Prior Function            PT Goals (current goals can now be found in the care plan section) Acute Rehab PT Goals Patient Stated Goal: go to rehab PT Goal Formulation: With patient Time For Goal Achievement: 03/19/16 Potential to Achieve Goals: Good Progress towards PT goals: Progressing toward goals    Frequency  BID    PT Plan Current plan remains appropriate    Co-evaluation             End of Session Equipment Utilized During Treatment: Gait belt Activity Tolerance: Patient tolerated treatment well Patient left: in bed;with call bell/phone within reach;with bed alarm set;with SCD's reapplied     Time: [id number] PT Time Calculation (min) (ACUTE ONLY): 39 min  Charges:  $Gait Training: 23-37 mins $Therapeutic Exercise: 8-22 mins                    G Codes:      Katherine Hartman 2016/03/25, 4:10 PM  Burnett Corrente, SPT [phone]

## 2016-03-07 NOTE — Progress Notes (Signed)
OT Cancellation Note  Patient Details Name: Katherine Hartman MRN: [id number] DOB: 1950-08-28   Cancelled Treatment:    Reason Eval/Treat Not Completed: Patient declined, no reason specified. Pt sitting up in bed eating lunch at 3:50pm stating she was too tired to get out of bed again today but agreed to work with OT tomorrow before going to [facility] for rehab.  Chrys Racer, OTR/L ascom [phone] 03/07/16, 3:51 PM

## 2016-03-07 NOTE — Progress Notes (Signed)
Clinical Education officer, museum (CSW) presented bed offers to patient. Patient chose [facility]. Plan is for patient to D/C to [facility] tomorrow pending medical clearance. [facility] liaison is aware of above. CSW will continue to follow and assist as needed.   [employer], LCSW [phone]

## 2016-03-07 NOTE — Discharge Summary (Addendum)
Physician Discharge Summary  Patient ID: Katherine [facility] MRN: [id number] DOB/AGE: 1951/07/20 65 y.o.  Admit date: 03/05/2016 Discharge date: 03/08/16 Admission Diagnoses:  Primary Osteoarthritis primary osteoarthritis   Discharge Diagnoses: Patient Active Problem List   Diagnosis Date Noted  . Primary localized osteoarthritis of left knee 03/05/2016  . Primary osteoarthritis of right knee 08/24/2015    Past Medical History:  Diagnosis Date  . Arthritis   . GERD (gastroesophageal reflux disease)   . Hypertension   . 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 female who was admitted 03/05/2016 with a diagnosis of left knee osteoarthritis and went to the operating room on 03/05/2016 and underwent the above named procedures.    Surgeries: Procedure(s): TOTAL KNEE ARTHROPLASTY on 03/05/2016 Patient tolerated the surgery well. Taken to PACU where she was stabilized and then transferred to the orthopedic floor.  Started on Lovenox 30 q 12 hrs. Foot pumps applied bilaterally at 80 mm. Heels elevated on bed with rolled towels. No evidence of DVT. Negative Homan. Physical therapy started on day #1 for gait training and transfer. OT started day #1 for ADL and assisted devices.  Patient's foley was d/c on day #1. Patient's IV  was d/c on day #2.  On post op day #3 patient was stable and ready for discharge to SNF.  Implants: Medacta GMK sphere system with 4 left femur, T3 I4 tibia with a short stem and 13 mm 4 insert, size 2 patella, all components cemented  She was given perioperative antibiotics:  Anti-infectives    Start     Dose/Rate Route Frequency Ordered Stop   03/05/16 1400  ceFAZolin (ANCEF) IVPB 2g/100 mL premix     2 g 200 mL/hr over 30 Minutes Intravenous Every 6 hours 03/05/16 1239 03/06/16 0255    03/05/16 0800  ceFAZolin (ANCEF) IVPB 2g/100 mL premix     2 g 200 mL/hr over 30 Minutes Intravenous  Once 03/05/16 0748 03/05/16 0953    .  She was given sequential compression devices, early ambulation, and Lovenox for DVT prophylaxis.  She benefited maximally from the hospital stay and there were no complications.    Recent vital signs:  Vitals:   03/07/16 0753 03/07/16 1626  BP: 137/62 (!) 106/50  Pulse: 76 71  Resp:  18  Temp: 98.7 F (37.1 C) 98.8 F (37.1 C)    Recent laboratory studies:  Lab Results  Component Value Date   HGB 11.4 (L) 03/07/2016   HGB 10.9 (L) 03/06/2016   HGB 11.1 (L) 03/05/2016   Lab Results  Component Value Date   WBC 12.0 (H) 03/07/2016   PLT 266 03/07/2016   Lab Results  Component Value Date   INR 1.05 02/14/2016   Lab Results  Component Value Date   NA 132 (L) 03/07/2016   K 3.5 03/07/2016   CL 96 (L) 03/07/2016   CO2 27 03/07/2016   BUN 16 03/07/2016   CREATININE 0.99 03/07/2016   GLUCOSE 93 03/07/2016    Discharge Medications:     Medication List    STOP taking these medications   HYDROcodone-acetaminophen 5-325 MG tablet Commonly known as:  NORCO/VICODIN   meloxicam 15 MG tablet Commonly known as:  MOBIC     TAKE these medications   AZO-CRANBERRY PO Take 2 tablets  by mouth daily.   bisoprolol-hydrochlorothiazide 5-6.25 MG tablet Commonly known as:  ZIAC Take 1 tablet by mouth daily.   cyclobenzaprine 10 MG tablet Commonly known as:  FLEXERIL Take 1 tablet (10 mg total) by mouth every 8 (eight) hours as needed for muscle spasms.   cyclobenzaprine 10 MG tablet Commonly known as:  FLEXERIL Take 1 tablet (10 mg total) by mouth 3 (three) times daily as needed for muscle spasms.   diphenhydrAMINE 25 MG tablet Commonly known as:  BENADRYL Take 25 mg by mouth daily as needed for allergies.   enoxaparin 40 MG/0.4ML injection Commonly known as:  LOVENOX Inject 0.4 mLs (40 mg total) into the skin daily.    multivitamin with minerals Tabs tablet Take 1 tablet by mouth daily.   omeprazole 40 MG capsule Commonly known as:  PRILOSEC Take 40 mg by mouth daily.   oxyCODONE 5 MG immediate release tablet Commonly known as:  Oxy IR/ROXICODONE Take 5 mg by mouth every 4 (four) hours as needed. What changed:  Another medication with the same name was added. Make sure you understand how and when to take each.   oxyCODONE 15 mg 12 hr tablet Commonly known as:  OXYCONTIN Take 1 tablet (15 mg total) by mouth every 12 (twelve) hours. What changed:  You were already taking a medication with the same name, and this prescription was added. Make sure you understand how and when to take each.   oxyCODONE 5 MG immediate release tablet Commonly known as:  Oxy IR/ROXICODONE Take 1-2 tablets (5-10 mg total) by mouth every 3 (three) hours as needed for breakthrough pain. What changed:  You were already taking a medication with the same name, and this prescription was added. Make sure you understand how and when to take each.   promethazine 25 MG tablet Commonly known as:  PHENERGAN Take 25 mg by mouth every 6 (six) hours as needed for nausea or vomiting.       Diagnostic Studies: Dg Knee 1-2 Views Left  Result Date: 03/05/2016 CLINICAL DATA:  Status post knee replacement. EXAM: LEFT KNEE - 1-2 VIEW COMPARISON:  October 12, 2015 FINDINGS: There is a rounded irregular high attenuation foreign body measuring 7 mm projected over the subcutaneous tissues of the knee at the level of the superior most skin staple not present on the March 2017 comparison. This is the same attenuation as bone. Femoral and tibial components are in good position. Postoperative changes. IMPRESSION: Possible bony foreign body in the subcutaneous tissues just deep to the superior most skin staple. No other abnormalities. Electronically Signed   By: Dorise Bullion III M.D   On: 03/05/2016 12:10    Disposition: 01-Home or Self Care      Contact information for follow-up providers    MENZ,MICHAEL, MD Follow up in 2 week(s).   Specialty:  Orthopedic Surgery Contact information: [place] [address] [phone]            Contact information for after-discharge care    Destination    [facility] .   Specialty:  [place] information: [address] [place] [phone]                   Signed: Feliberto Gottron 03/07/2016, 5:17 PM     TOTAL KNEE REPLACEMENT POSTOPERATIVE DIRECTIONS  Knee Rehabilitation, Guidelines Following Surgery  Results after knee surgery are often greatly improved when  you follow the exercise, range of motion and muscle strengthening exercises prescribed by your doctor. Safety measures are also important to protect the knee from further injury. Any time any of these exercises cause you to have increased pain or swelling in your knee joint, decrease the amount until you are comfortable again and slowly increase them. If you have problems or questions, call your caregiver or physical therapist for advice.   HOME CARE INSTRUCTIONS  Remove items at home which could result in a fall. This includes throw rugs or furniture in walking pathways.   Continue to use polar care unit on the knee for pain and swelling from surgery. You may notice swelling that will progress down to the foot and ankle.  This is normal after surgery.  Elevate the leg when you are not up walking on it.    Continue to use the breathing machine which will help keep your temperature down.  It is common for your temperature to cycle up and down following surgery, especially at night when you are not up moving around and exerting yourself.  The breathing machine keeps your lungs expanded and your temperature down.  Do not place pillow under knee, focus on keeping the knee STRAIGHT while  resting  DIET You may resume your previous home diet once your are discharged from the hospital.  DRESSING / WOUND CARE / SHOWERING You may start showering once staples have been removed. Change the surgical dressing as needed.  ACTIVITY Walk with your walker as instructed. Use walker as long as suggested by your caregivers. Avoid periods of inactivity such as sitting longer than an hour when not asleep. This helps prevent blood clots.  You may resume a sexual relationship in one month or when given the OK by your doctor.  You may return to work once you are cleared by your doctor.  Do not drive a car for 6 weeks or until released by you surgeon.  Do not drive while taking narcotics.  WEIGHT BEARING Weight bearing as tolerated with assist device (walker, cane, etc) as directed, use it as long as suggested by your surgeon or therapist, typically at least 4-6 weeks.  POSTOPERATIVE CONSTIPATION PROTOCOL Constipation - defined medically as fewer than three stools per week and severe constipation as less than one stool per week.  One of the most common issues patients have following surgery is constipation.  Even if you have a regular bowel pattern at home, your normal regimen is likely to be disrupted due to multiple reasons following surgery.  Combination of anesthesia, postoperative narcotics, change in appetite and fluid intake all can affect your bowels.  In order to avoid complications following surgery, here are some recommendations in order to help you during your recovery period.  Colace (docusate) - Pick up an over-the-counter form of Colace or another stool softener and take twice a day as long as you are requiring postoperative pain medications.  Take with a full glass of water daily.  If you experience loose stools or diarrhea, hold the colace until you stool forms back up.  If your symptoms do not get better within 1 week or if they get worse, check with your doctor.  Dulcolax  (bisacodyl) - Pick up over-the-counter and take as directed by the product packaging as needed to assist with the movement of your bowels.  Take with a full glass of water.  Use this product as needed if not relieved by Colace only.   MiraLax (polyethylene glycol) -  Pick up over-the-counter to have on hand.  MiraLax is a solution that will increase the amount of water in your bowels to assist with bowel movements.  Take as directed and can mix with a glass of water, juice, soda, coffee, or tea.  Take if you go more than two days without a movement. Do not use MiraLax more than once per day. Call your doctor if you are still constipated or irregular after using this medication for 7 days in a row.  If you continue to have problems with postoperative constipation, please contact the office for further assistance and recommendations.  If you experience "the worst abdominal pain ever" or develop nausea or vomiting, please contact the office immediatly for further recommendations for treatment.  ITCHING  If you experience itching with your medications, try taking only a single pain pill, or even half a pain pill at a time.  You can also use Benadryl over the counter for itching or also to help with sleep.   TED HOSE STOCKINGS Wear the elastic stockings on both legs for six weeks following surgery during the day but you may remove then at night for sleeping.  MEDICATIONS See your medication summary on the "After Visit Summary" that the nursing staff will review with you prior to discharge.  You may have some home medications which will be placed on hold until you complete the course of blood thinner medication.  It is important for you to complete the blood thinner medication as prescribed by your surgeon.  Continue your approved medications as instructed at time of discharge.  PRECAUTIONS If you experience chest pain or shortness of breath - call 911 immediately for transfer to the hospital emergency  department.  If you develop a fever greater that 101 F, purulent drainage from wound, increased redness or drainage from wound, foul odor from the wound/dressing, or calf pain - CONTACT YOUR SURGEON.                                                   FOLLOW-UP APPOINTMENTS Make sure you keep all of your appointments after your operation with your surgeon and caregivers. You should call the office at the above phone number and make an appointment for approximately two weeks after the date of your surgery or on the date instructed by your surgeon outlined in the "After Visit Summary".   RANGE OF MOTION AND STRENGTHENING EXERCISES  Rehabilitation of the knee is important following a knee injury or an operation. After just a few days of immobilization, the muscles of the thigh which control the knee become weakened and shrink (atrophy). Knee exercises are designed to build up the tone and strength of the thigh muscles and to improve knee motion. Often times heat used for twenty to thirty minutes before working out will loosen up your tissues and help with improving the range of motion but do not use heat for the first two weeks following surgery. These exercises can be done on a training (exercise) mat, on the floor, on a table or on a bed. Use what ever works the best and is most comfortable for you Knee exercises include:  Leg Lifts - While your knee is still immobilized in a splint or cast, you can do straight leg raises. Lift the leg to 60 degrees, hold for 3 sec, and  slowly lower the leg. Repeat 10-20 times 2-3 times daily. Perform this exercise against resistance later as your knee gets better.  Quad and Hamstring Sets - Tighten up the muscle on the front of the thigh (Quad) and hold for 5-10 sec. Repeat this 10-20 times hourly. Hamstring sets are done by pushing the foot backward against an object and holding for 5-10 sec. Repeat as with quad sets.   Leg Slides: Lying on your back, slowly slide your  foot toward your buttocks, bending your knee up off the floor (only go as far as is comfortable). Then slowly slide your foot back down until your leg is flat on the floor again.  Angel Wings: Lying on your back spread your legs to the side as far apart as you can without causing discomfort.  A rehabilitation program following serious knee injuries can speed recovery and prevent re-injury in the future due to weakened muscles. Contact your doctor or a physical therapist for more information on knee rehabilitation.   IF YOU ARE TRANSFERRED TO A SKILLED REHAB FACILITY If the patient is transferred to a skilled rehab facility following release from the hospital, a list of the current medications will be sent to the facility for the patient to continue.  When discharged from the skilled rehab facility, please have the facility set up the patient's [place] prior to being released. Also, the skilled facility will be responsible for providing the patient with their medications at time of release from the facility to include their pain medication, the muscle relaxants, and their blood thinner medication. If the patient is still at the rehab facility at time of the two week follow up appointment, the skilled rehab facility will also need to assist the patient in arranging follow up appointment in our office and any transportation needs.  MAKE SURE YOU:  Understand these instructions.  Get help right away if you are not doing well or get worse.

## 2016-03-08 DIAGNOSIS — Z5189 Encounter for other specified aftercare: Secondary | ICD-10-CM | POA: Diagnosis not present

## 2016-03-08 DIAGNOSIS — M25569 Pain in unspecified knee: Secondary | ICD-10-CM | POA: Diagnosis not present

## 2016-03-08 DIAGNOSIS — K210 Gastro-esophageal reflux disease with esophagitis: Secondary | ICD-10-CM | POA: Diagnosis not present

## 2016-03-08 DIAGNOSIS — K219 Gastro-esophageal reflux disease without esophagitis: Secondary | ICD-10-CM | POA: Diagnosis not present

## 2016-03-08 DIAGNOSIS — D509 Iron deficiency anemia, unspecified: Secondary | ICD-10-CM | POA: Diagnosis not present

## 2016-03-08 DIAGNOSIS — R600 Localized edema: Secondary | ICD-10-CM | POA: Diagnosis not present

## 2016-03-08 DIAGNOSIS — M25562 Pain in left knee: Secondary | ICD-10-CM | POA: Diagnosis not present

## 2016-03-08 DIAGNOSIS — I10 Essential (primary) hypertension: Secondary | ICD-10-CM | POA: Diagnosis not present

## 2016-03-08 DIAGNOSIS — M6281 Muscle weakness (generalized): Secondary | ICD-10-CM | POA: Diagnosis not present

## 2016-03-08 DIAGNOSIS — Z743 Need for continuous supervision: Secondary | ICD-10-CM | POA: Diagnosis not present

## 2016-03-08 DIAGNOSIS — D649 Anemia, unspecified: Secondary | ICD-10-CM | POA: Diagnosis not present

## 2016-03-08 DIAGNOSIS — M1712 Unilateral primary osteoarthritis, left knee: Secondary | ICD-10-CM | POA: Diagnosis not present

## 2016-03-08 DIAGNOSIS — S82009A Unspecified fracture of unspecified patella, initial encounter for closed fracture: Secondary | ICD-10-CM | POA: Diagnosis not present

## 2016-03-08 DIAGNOSIS — R262 Difficulty in walking, not elsewhere classified: Secondary | ICD-10-CM | POA: Diagnosis not present

## 2016-03-08 DIAGNOSIS — Z471 Aftercare following joint replacement surgery: Secondary | ICD-10-CM | POA: Diagnosis not present

## 2016-03-08 NOTE — Clinical Social Work Placement (Signed)
   CLINICAL SOCIAL WORK PLACEMENT  NOTE  Date:  03/08/2016  Patient Details  Name: Katherine [facility]man MRN: [id number] Date of Birth: 1951/05/01  Clinical Social Work is seeking post-discharge placement for this patient at the [place] level of care (*CSW will initial, date and re-position this form in  chart as items are completed):  Yes   Patient/family provided with [place] Work Department's list of facilities offering this level of care within the geographic area requested by the patient (or if unable, by the patient's family).  Yes   Patient/family informed of their freedom to choose among providers that offer the needed level of care, that participate in Medicare, Medicaid or managed care program needed by the patient, have an available bed and are willing to accept the patient.  Yes   Patient/family informed of [facility]'s ownership interest in [facility] and [facility], as well as of the fact that they are under no obligation to receive care at these facilities.  PASRR submitted to EDS on       PASRR number received on       Existing PASRR number confirmed on 03/06/16     FL2 transmitted to all facilities in geographic area requested by pt/family on 03/06/16     FL2 transmitted to all facilities within larger geographic area on       Patient informed that his/her managed care company has contracts with or will negotiate with certain facilities, including the following:        Yes   Patient/family informed of bed offers received.  [facility]     Physician recommends and patient chooses bed at      Patient to be transferred to  ([facility] ) on 03/08/16.  Patient to be transferred to facility by  [facility] )     Patient family notified on 03/08/16 of transfer.  Name of family member notified:   ([place] left patient's son Elenore Rota a Advertising account executive. )     PHYSICIAN       Additional Comment:     _______________________________________________ Kamari Buch, Veronia Beets, LCSW 03/08/2016, 10:35 AM

## 2016-03-08 NOTE — Progress Notes (Signed)
Patient is medically stable for D/C to [facility] today. Per Katherine [facility] liaison patient will go to room 603. RN will call report to [facility] and arrange EMS for transport. Clinical Education officer, museum (CSW) sent D/C orders to [employer] via [employer]. Patient is aware of above. CSW attempted to contact patient's son Katherine Hartman however he did not answer and a voicemail was left. Please reconsult if future social work needs arise. CSW signing off.   [employer], LCSW [phone]

## 2016-03-08 NOTE — Progress Notes (Signed)
Sift assessment completed. Pt found without teds on, without foot pumps on, and without polar care on her Lknee. Dressing to l knee is honeycomb, dry and intact, no drainage noted. Pt stated she cannot tolerate "them stockings" and said that the foot pump "wouldnt work". See flowsheet for assessment. With am meds, pt requested and received oxycodone for pain and benadryl as well when she was told that flexeril was not availabel to be given yet. Pt complaining of being unable to sleep overnight, c/o pain 8/10 to l knee. Pt aware of possible d/c today, has insisted to this writer that this will not happen until after lunch.

## 2016-03-08 NOTE — Progress Notes (Signed)
Physical Therapy Treatment Patient Details Name: Katherine Hartman MRN: [id number] DOB: [birth year] Today's Date: 03/08/2016    History of Present Illness 65 y/o female admitted for L TKA s/p primary localized OA of L knee. Pt has a history of smoking and HTN.    PT Comments    Pt is progressing towards goals. She is not independent with bed mobility with increased time and effort. She ambulated 64' with slightly improved gait mechanics; she is able to increase the L SLS phase of gait and is less antalgic. Terminal extension is still significantly lacking on L: L knee AAROM is 12-87 degrees, and pt is having difficulty isolating quadriceps muscle in ther-ex. Pt's affect and demeanor have improved today; she expressed gratitude and apologized for being difficult. Pt is appropriate for continued PT at this time to address deficits in strength, balance, coordination, endurance, gait, and safe use of DME.     Follow Up Recommendations  SNF     Equipment Recommendations  None recommended by PT    Recommendations for Other Services       Precautions / Restrictions Precautions Precautions: Fall Precaution Comments: Still lacking terminal extention on L and fatigues rapidly Restrictions Weight Bearing Restrictions: Yes LLE Weight Bearing: Weight bearing as tolerated    Mobility  Bed Mobility Overal bed mobility: Independent Bed Mobility: Supine to Sit     Supine to sit: Supervision     General bed mobility comments: Pt required extended time and increased effort, but was able to conduct bed mobility independently  Transfers Overall transfer level: Needs assistance Equipment used: Rolling walker (2 wheeled) Transfers: Sit to/from Stand Sit to Stand: Min guard         General transfer comment: Pt is able to move from sit-to-stand at EOB with R LE and min assist with cuing for foot and hand placement for safe technique  Ambulation/Gait Ambulation/Gait assistance: Min  assist Ambulation Distance (Feet): 50 Feet Assistive device: Rolling walker (2 wheeled) Gait Pattern/deviations: Step-through pattern;Decreased stride length;Antalgic;Trunk flexed   Gait velocity interpretation: <1.8 ft/sec, indicative of risk for recurrent falls General Gait Details: Pt is antalgic and relies heavily on UEs to offload L LE. She was able to tolerate >50% of her weight on L LE in quiet standing. Gait has progressed to a step-through-like pattern with extensive cuing. Pt required sitting break X 5 min with 50' ambulation   Stairs            Wheelchair Mobility    Modified Rankin (Stroke Patients Only)       Balance Overall balance assessment: Needs assistance Sitting-balance support: Single extremity supported Sitting balance-Leahy Scale: Good Sitting balance - Comments: Pt has good trunk control and was able to sit indepentently    Standing balance support: Bilateral upper extremity supported Standing balance-Leahy Scale: Poor Standing balance comment: Pt required RW for standing balance. She is antalgic on L and has moderate anxiety about falling.                     Cognition Arousal/Alertness: Awake/alert Behavior During Therapy: Anxious Overall Cognitive Status: Within Functional Limits for tasks assessed                      Exercises Total Joint Exercises Ankle Circles/Pumps: Other reps (comment);Both;Strengthening;Seated (30; with min assist and vebal/tactile cuing) Quad Sets: 10 reps;AROM;Seated (with min assist and vebal/tactile cuing) Heel Slides: Other reps (comment);Left;AAROM;Seated (8; min assist) Long Arc Quad: Other reps (  comment);AAROM;Seated;Left (8; with min assist) Goniometric ROM: L LE AAROM 12-87 degrees Other Exercises Other Exercises: mini squats from EOB elevated with RW and CGA X 6 reps with cuing for proper/safe technique    General Comments        Pertinent Vitals/Pain Pain Assessment: 0-10 Pain Score:  7  Pain Location: medial anterior knee Pain Descriptors / Indicators: Aching;Grimacing;Guarding Pain Intervention(s): Limited activity within patient's tolerance;Monitored during session;Premedicated before session;Ice applied;Repositioned    Home Living                      Prior Function            PT Goals (current goals can now be found in the care plan section) Acute Rehab PT Goals Patient Stated Goal: go to rehab PT Goal Formulation: With patient Time For Goal Achievement: 03/19/16 Potential to Achieve Goals: Good Progress towards PT goals: Progressing toward goals    Frequency  BID    PT Plan Current plan remains appropriate    Co-evaluation             End of Session Equipment Utilized During Treatment: Gait belt Activity Tolerance: Patient tolerated treatment well Patient left: in bed;with call bell/phone within reach;with bed alarm set     Time: 0930-1008 PT Time Calculation (min) (ACUTE ONLY): 38 min  Charges:                       G Codes:      Katherine [facility]man 2016-03-22, 11:51 AM  Burnett Corrente, SPT [phone]

## 2016-03-08 NOTE — Progress Notes (Signed)
   Subjective: 3 Days Post-Op Procedure(s) (LRB): TOTAL KNEE ARTHROPLASTY (Left) Patient reports pain as moderate.   Patient is well, and has had no acute complaints or problems Denies any CP, SOB, ABD pain. We will continue therapy today.  Plan is to go Skilled nursing facility after hospital stay.  Objective: Vital signs in last 24 hours: Temp:  [98.7 F (37.1 C)-100.1 F (37.8 C)] 99 F (37.2 C) (08/11 0421) Pulse Rate:  [71-79] 77 (08/11 0421) Resp:  [18-19] 19 (08/11 0421) BP: (106-137)/(50-77) 113/77 (08/11 0421) SpO2:  [97 %-100 %] 100 % (08/11 0421)  Intake/Output from previous day: 08/10 0701 - 08/11 0700 In: 240 [P.O.:240] Out: 150 [Urine:150] Intake/Output this shift: No intake/output data recorded.   Recent Labs  03/05/16 1252 03/06/16 0348 03/07/16 0413  HGB 11.1* 10.9* 11.4*    Recent Labs  03/06/16 0348 03/07/16 0413  WBC 10.2 12.0*  RBC 4.04 4.26  HCT 32.0* 34.2*  PLT 279 266    Recent Labs  03/06/16 0348 03/07/16 0413  NA 134* 132*  K 3.9 3.5  CL 99* 96*  CO2 26 27  BUN 15 16  CREATININE 0.84 0.99  GLUCOSE 111* 93  CALCIUM 9.0 9.0   No results for input(s): LABPT, INR in the last 72 hours.  EXAM General - Patient is Alert, Appropriate and Oriented Extremity - Neurovascular intact Sensation intact distally Intact pulses distally Dorsiflexion/Plantar flexion intact No cellulitis present Compartment soft Dressing - dressing C/D/I, no drainage and dressing changed Motor Function - intact, moving foot and toes well on exam.   Past Medical History:  Diagnosis Date  . Arthritis   . GERD (gastroesophageal reflux disease)   . Hypertension   . 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   Weight as of this encounter: 99.3 kg (219 lb). Advance diet Up with therapy  Plan on discharge to SNF today Follow up with [facility] in 2 weeks Ted hose daily BLE   DVT Prophylaxis - Lovenox, Foot Pumps and TED hose Weight-Bearing as tolerated to left leg   T. Rachelle Hora, PA-C [place] 03/08/2016, 7:52 AM

## 2016-03-08 NOTE — Care Management Important Message (Signed)
Important Message  Patient Details  Name: Katherine Hartman MRN: [id number] Date of Birth: [birth year]   Medicare Important Message Given:  Yes    Alvie Heidelberg, RN 03/08/2016, 9:57 AM

## 2016-03-08 NOTE — Progress Notes (Signed)
Report given to Kim,RN at [facility].

## 2016-03-10 DIAGNOSIS — M25569 Pain in unspecified knee: Secondary | ICD-10-CM | POA: Diagnosis not present

## 2016-03-10 DIAGNOSIS — D649 Anemia, unspecified: Secondary | ICD-10-CM | POA: Diagnosis not present

## 2016-03-10 DIAGNOSIS — I10 Essential (primary) hypertension: Secondary | ICD-10-CM | POA: Diagnosis not present

## 2016-03-10 DIAGNOSIS — K219 Gastro-esophageal reflux disease without esophagitis: Secondary | ICD-10-CM | POA: Diagnosis not present

## 2016-03-20 DIAGNOSIS — I10 Essential (primary) hypertension: Secondary | ICD-10-CM | POA: Diagnosis not present

## 2016-03-20 DIAGNOSIS — S82009A Unspecified fracture of unspecified patella, initial encounter for closed fracture: Secondary | ICD-10-CM | POA: Diagnosis not present

## 2016-03-20 DIAGNOSIS — R600 Localized edema: Secondary | ICD-10-CM | POA: Diagnosis not present

## 2016-03-20 DIAGNOSIS — K219 Gastro-esophageal reflux disease without esophagitis: Secondary | ICD-10-CM | POA: Diagnosis not present

## 2016-04-11 ENCOUNTER — Encounter: Payer: Self-pay | Admitting: *Deleted

## 2016-04-11 ENCOUNTER — Emergency Department: Payer: Medicare Other

## 2016-04-11 ENCOUNTER — Emergency Department
Admission: EM | Admit: 2016-04-11 | Discharge: 2016-04-11 | Disposition: A | Discharge status: Home / Self Care | Payer: Medicare Other | Attending: Emergency Medicine | Admitting: Emergency Medicine

## 2016-04-11 DIAGNOSIS — F17210 Nicotine dependence, cigarettes, uncomplicated: Secondary | ICD-10-CM | POA: Insufficient documentation

## 2016-04-11 DIAGNOSIS — S8002XA Contusion of left knee, initial encounter: Secondary | ICD-10-CM | POA: Diagnosis not present

## 2016-04-11 DIAGNOSIS — M25562 Pain in left knee: Secondary | ICD-10-CM

## 2016-04-11 DIAGNOSIS — Z96652 Presence of left artificial knee joint: Secondary | ICD-10-CM | POA: Diagnosis not present

## 2016-04-11 DIAGNOSIS — Y999 Unspecified external cause status: Secondary | ICD-10-CM | POA: Insufficient documentation

## 2016-04-11 DIAGNOSIS — Y929 Unspecified place or not applicable: Secondary | ICD-10-CM | POA: Insufficient documentation

## 2016-04-11 DIAGNOSIS — Y939 Activity, unspecified: Secondary | ICD-10-CM | POA: Insufficient documentation

## 2016-04-11 DIAGNOSIS — I10 Essential (primary) hypertension: Secondary | ICD-10-CM | POA: Diagnosis not present

## 2016-04-11 DIAGNOSIS — S8992XA Unspecified injury of left lower leg, initial encounter: Secondary | ICD-10-CM | POA: Diagnosis present

## 2016-04-11 DIAGNOSIS — W1839XA Other fall on same level, initial encounter: Secondary | ICD-10-CM | POA: Diagnosis not present

## 2016-04-11 MED ORDER — OXYCODONE HCL 5 MG PO TABS: 5.0000 mg | ORAL_TABLET | ORAL | 0 refills | Status: DC | PRN

## 2016-04-11 MED ORDER — CYCLOBENZAPRINE HCL 10 MG PO TABS: 10.0000 mg | ORAL_TABLET | Freq: Three times a day (TID) | ORAL | 0 refills | Status: DC | PRN

## 2016-04-11 MED ORDER — OXYCODONE HCL 5 MG PO TABS
10.0000 mg | ORAL_TABLET | Freq: Once | ORAL | Status: AC
  Administered 2016-04-11: 10 mg via ORAL
  Filled 2016-04-11: qty 2

## 2016-04-11 NOTE — ED Notes (Signed)
See triage note  states pain to left knee s/p fall  Knee gave out during PT

## 2016-04-11 NOTE — ED Triage Notes (Signed)
Pt arrived to ED from physical therapy after reporting her "left knee gave out after I stood up" Pt reports she then hit her knee during the fall and has increased pain. No increased swelling reported. PT had knee replaced 04/05/16. No obvious deformity noted.

## 2016-04-11 NOTE — ED Notes (Signed)
Pt discharged home after verbalizing understanding of discharge instructions; nad noted. 

## 2016-04-11 NOTE — ED Provider Notes (Signed)
[facility] Emergency Department Provider Note  ____________________________________________  Time seen: Approximately 3:03 PM  I have reviewed the triage vital signs and the nursing notes.   HISTORY  Chief Complaint Knee Pain    HPI Katherine Hartman is a 65 y.o. female presents for evaluation of injury to her left knee. Patient had a recent knee replacement in August and fell getting into the pain after physical therapy today. Complains of continued pain.   Past Medical History:  Diagnosis Date  . Arthritis   . GERD (gastroesophageal reflux disease)   . Hypertension   . 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 Primary localized osteoarthritis of left knee 03/05/2016  . Primary osteoarthritis of right knee 08/24/2015    Past Surgical History:  Procedure Laterality Date  . ABDOMINAL HYSTERECTOMY    . BLADDER SUSPENSION N/A   . JOINT REPLACEMENT Right 2017   knee  . 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 TOTAL KNEE ARTHROPLASTY Left 03/05/2016   Procedure: TOTAL KNEE ARTHROPLASTY;  Surgeon: Hessie Knows, MD;  Location: [facility] ORS;  Service: Orthopedics;  Laterality: Left;  . TUBAL LIGATION      Prior to Admission medications   Medication Sig Start Date End Date Taking? Authorizing Provider  AZO-CRANBERRY PO Take 2 tablets by mouth daily.    Historical Provider, MD  bisoprolol-hydrochlorothiazide (ZIAC) 5-6.25 MG tablet Take 1 tablet by mouth daily. 11/23/15   Historical Provider, MD  cyclobenzaprine (FLEXERIL) 10 MG tablet Take 1 tablet (10 mg total) by mouth 3 (three) times daily as needed for muscle spasms. 04/11/16   Arlyss Repress, PA-C  diphenhydrAMINE (BENADRYL) 25 MG tablet Take 25 mg by mouth daily  as needed for allergies.    Historical Provider, MD  enoxaparin (LOVENOX) 40 MG/0.4ML injection Inject 0.4 mLs (40 mg total) into the skin daily. 03/07/16 03/22/16  Duanne Guess, PA-C  Multiple Vitamin (MULTIVITAMIN WITH MINERALS) TABS tablet Take 1 tablet by mouth daily.    Historical Provider, MD  omeprazole (PRILOSEC) 40 MG capsule Take 40 mg by mouth daily.    Historical Provider, MD  oxyCODONE (OXY IR/ROXICODONE) 5 MG immediate release tablet Take 1 tablet (5 mg total) by mouth every 4 (four) hours as needed for severe pain. 04/11/16   Arlyss Repress, PA-C  promethazine (PHENERGAN) 25 MG tablet Take 25 mg by mouth every 6 (six) hours as needed for nausea or vomiting.    Historical Provider, MD    Allergies Ibuprofen  Family History  Problem Relation Age of Onset  . Cancer Mother   . Hypertension Mother   . Diabetes Mother     Social History Social History  Substance Use Topics  . Smoking status: Current Every Day Smoker    Packs/day: 0.25    Years: 50.00    Types: Cigarettes  . Smokeless tobacco: Never Used  . Alcohol use No     Comment: stopped in Sept. 2016    Review of Systems Constitutional: No fever/chills Eyes: No visual changes. ENT: No sore throat. Cardiovascular: Denies chest pain. Respiratory: Denies shortness of breath. Gastrointestinal: No abdominal pain.  No nausea, no vomiting.  No diarrhea.  No constipation. Genitourinary: Negative for dysuria. Musculoskeletal: Positive left knee pain  medial greater than lateral. Skin: Negative for rash. Neurological: Negative for headaches, focal weakness or numbness.  10-point ROS otherwise negative.  ____________________________________________   PHYSICAL EXAM:  VITAL SIGNS: ED Triage Vitals  Enc Vitals Group     BP 04/11/16 1422 (!) 148/82     Pulse Rate 04/11/16 1422 87     Resp 04/11/16 1422 20     Temp 04/11/16 1422 98.1 F (36.7 C)     Temp Source 04/11/16 1422 Oral     SpO2 04/11/16 1422 99 %      Weight 04/11/16 1423 226 lb (102.5 kg)     Height 04/11/16 1423 5\' 8"  (1.727 m)     Head Circumference --      [facility] Flow --      Pain Score 04/11/16 1440 9     Pain Loc --      Pain Edu? --      Excl. in [place]? --     Constitutional: Alert and oriented. Well appearing and in no acute distress. Musculoskeletal: Left knee with limited range of motion postoperative scarring noted anteriorly. Point tenderness noted to the medial aspect. Distally neurovascularly intact. Neurologic:  Normal speech and language. No gross focal neurologic deficits are appreciated. No gait instability. Skin:  Skin is warm, dry and intact. No rash noted. Psychiatric: Mood and affect are normal. Speech and behavior are normal.  ____________________________________________   LABS (all labs ordered are listed, but only abnormal results are displayed)  Labs Reviewed - No data to display ____________________________________________  EKG   ____________________________________________  RADIOLOGY   ____________________________________________   PROCEDURES  Procedure(s) performed: None  Critical Care performed: No  ____________________________________________   INITIAL IMPRESSION / ASSESSMENT AND PLAN / ED COURSE  Pertinent labs & imaging results that were available during my care of the patient were reviewed by me and considered in my medical decision making (see chart for details). Review of the [place] CSRS was performed in accordance of the [place] prior to dispensing any controlled drugs.  Status post fall left knee contusion. Reassurance provided to the patient. Rx given for Flexeril and oxycodone. Patient follow-up with her PCP or Dr. Rudene Christians as needed.  Clinical Course    ____________________________________________   FINAL CLINICAL IMPRESSION(S) / ED DIAGNOSES  Final diagnoses:  Knee pain, acute, left  Knee contusion, left, initial encounter     This chart was dictated using voice  recognition software/Dragon. Despite best efforts to proofread, errors can occur which can change the meaning. Any change was purely unintentional.    Arlyss Repress, PA-C 04/11/16 [place], MD 04/11/16 [phone]

## 2016-04-17 DIAGNOSIS — Z96652 Presence of left artificial knee joint: Secondary | ICD-10-CM | POA: Diagnosis not present

## 2016-04-29 DIAGNOSIS — M170 Bilateral primary osteoarthritis of knee: Secondary | ICD-10-CM | POA: Diagnosis not present

## 2016-04-29 DIAGNOSIS — L02214 Cutaneous abscess of groin: Secondary | ICD-10-CM | POA: Diagnosis not present

## 2016-04-29 DIAGNOSIS — I10 Essential (primary) hypertension: Secondary | ICD-10-CM | POA: Diagnosis not present

## 2016-05-06 DIAGNOSIS — Z96652 Presence of left artificial knee joint: Secondary | ICD-10-CM | POA: Diagnosis not present

## 2016-06-05 DIAGNOSIS — M1711 Unilateral primary osteoarthritis, right knee: Secondary | ICD-10-CM | POA: Diagnosis not present

## 2016-06-05 DIAGNOSIS — Z96652 Presence of left artificial knee joint: Secondary | ICD-10-CM | POA: Diagnosis not present

## 2016-06-27 ENCOUNTER — Encounter: Payer: Self-pay | Admitting: Emergency Medicine

## 2016-06-27 ENCOUNTER — Emergency Department
Admission: EM | Admit: 2016-06-27 | Discharge: 2016-06-27 | Disposition: A | Discharge status: Home / Self Care | Payer: Medicare Other | Attending: Student in an Organized Health Care Education/Training Program | Admitting: Student in an Organized Health Care Education/Training Program

## 2016-06-27 DIAGNOSIS — Z79899 Other long term (current) drug therapy: Secondary | ICD-10-CM | POA: Insufficient documentation

## 2016-06-27 DIAGNOSIS — Y999 Unspecified external cause status: Secondary | ICD-10-CM | POA: Insufficient documentation

## 2016-06-27 DIAGNOSIS — S39012A Strain of muscle, fascia and tendon of lower back, initial encounter: Secondary | ICD-10-CM | POA: Diagnosis not present

## 2016-06-27 DIAGNOSIS — I10 Essential (primary) hypertension: Secondary | ICD-10-CM | POA: Insufficient documentation

## 2016-06-27 DIAGNOSIS — Y9389 Activity, other specified: Secondary | ICD-10-CM | POA: Diagnosis not present

## 2016-06-27 DIAGNOSIS — X509XXA Other and unspecified overexertion or strenuous movements or postures, initial encounter: Secondary | ICD-10-CM | POA: Insufficient documentation

## 2016-06-27 DIAGNOSIS — Z7901 Long term (current) use of anticoagulants: Secondary | ICD-10-CM | POA: Diagnosis not present

## 2016-06-27 DIAGNOSIS — F17210 Nicotine dependence, cigarettes, uncomplicated: Secondary | ICD-10-CM | POA: Diagnosis not present

## 2016-06-27 DIAGNOSIS — S3992XA Unspecified injury of lower back, initial encounter: Secondary | ICD-10-CM | POA: Diagnosis present

## 2016-06-27 DIAGNOSIS — Y929 Unspecified place or not applicable: Secondary | ICD-10-CM | POA: Insufficient documentation

## 2016-06-27 MED ORDER — OXYCODONE HCL 5 MG PO TABS: 5.0000 mg | ORAL_TABLET | ORAL | 0 refills | Status: AC | PRN

## 2016-06-27 MED ORDER — ORPHENADRINE CITRATE 30 MG/ML IJ SOLN
60.0000 mg | Freq: Two times a day (BID) | INTRAMUSCULAR | Status: DC
  Administered 2016-06-27: 60 mg via INTRAMUSCULAR
  Filled 2016-06-27: qty 2

## 2016-06-27 MED ORDER — CYCLOBENZAPRINE HCL 10 MG PO TABS: 10.0000 mg | ORAL_TABLET | Freq: Three times a day (TID) | ORAL | 0 refills | Status: AC | PRN

## 2016-06-27 MED ORDER — KETOROLAC TROMETHAMINE 30 MG/ML IJ SOLN
30.0000 mg | Freq: Once | INTRAMUSCULAR | Status: AC
  Administered 2016-06-27: 30 mg via INTRAMUSCULAR
  Filled 2016-06-27: qty 1

## 2016-06-27 NOTE — ED Triage Notes (Signed)
Says she was moving bed and now her back is out.

## 2016-06-27 NOTE — ED Provider Notes (Signed)
[facility] Emergency Department Provider Note  ____________________________________________  Time seen: Approximately 10:22 AM  I have reviewed the triage vital signs and the nursing notes.   HISTORY  Chief Complaint Back Pain    HPI Katherine Hartman is a 65 y.o. female, NAD, presents to the emergency department with several hour history of low back pain.  States she had sudden onset of lower back pain after moving a bed last night. Pain is not improved with over-the-counter medications. She denies any saddle paresthesias or loss of bowel or bladder control. Has had no numbness or weakness or tingling in her extremities. She has had similar symptoms 10 years ago and was treated with a muscle relaxer and pain medicine, which helped.  Has had full range of motion in her lower extremities but states is painful to talk or bend. Denies falls or traumas. Denies any redness, swelling, abnormal warmth bruising to the area.   Past Medical History:  Diagnosis Date  . Arthritis   . GERD (gastroesophageal reflux disease)   . Hypertension   . 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 Primary localized osteoarthritis of left knee 03/05/2016  . Primary osteoarthritis of right knee 08/24/2015    Past Surgical History:  Procedure Laterality Date  . ABDOMINAL HYSTERECTOMY    . BLADDER SUSPENSION N/A   . JOINT REPLACEMENT Right 2017   knee  . 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 TOTAL KNEE ARTHROPLASTY Left 03/05/2016   Procedure: TOTAL KNEE ARTHROPLASTY;  Surgeon: Hessie Knows, MD;  Location: [facility] ORS;  Service: Orthopedics;  Laterality: Left;  . TUBAL LIGATION      Prior to Admission medications   Medication Sig Start Date  End Date Taking? Authorizing Provider  AZO-CRANBERRY PO Take 2 tablets by mouth daily.    Historical Provider, MD  bisoprolol-hydrochlorothiazide (ZIAC) 5-6.25 MG tablet Take 1 tablet by mouth daily. 11/23/15   Historical Provider, MD  cyclobenzaprine (FLEXERIL) 10 MG tablet Take 1 tablet (10 mg total) by mouth 3 (three) times daily as needed for muscle spasms. 06/27/16   Markale Birdsell L Annamay Laymon, PA-C  diphenhydrAMINE (BENADRYL) 25 MG tablet Take 25 mg by mouth daily as needed for allergies.    Historical Provider, MD  enoxaparin (LOVENOX) 40 MG/0.4ML injection Inject 0.4 mLs (40 mg total) into the skin daily. 03/07/16 03/22/16  Duanne Guess, PA-C  Multiple Vitamin (MULTIVITAMIN WITH MINERALS) TABS tablet Take 1 tablet by mouth daily.    Historical Provider, MD  omeprazole (PRILOSEC) 40 MG capsule Take 40 mg by mouth daily.    Historical Provider, MD  oxyCODONE (OXY IR/ROXICODONE) 5 MG immediate release tablet Take 1 tablet (5 mg total) by mouth every 4 (four) hours as needed for severe pain. 06/27/16   Markayla Reichart L Berda Shelvin, PA-C  promethazine (PHENERGAN) 25 MG tablet Take 25 mg by mouth every 6 (six) hours as needed for nausea or vomiting.    Historical Provider, MD    Allergies Ibuprofen and Tylenol [acetaminophen]  Family History  Problem Relation Age of Onset  . Cancer Mother   . Hypertension Mother   . Diabetes Mother     Social History Social History  Substance Use Topics  . Smoking status: Current Every Day Smoker  Packs/day: 0.25    Years: 50.00    Types: Cigarettes  . Smokeless tobacco: Never Used  . Alcohol use No     Comment: stopped in Sept. 2016     Review of Systems  Constitutional: No Fatigue Cardiovascular: No chest pain. Respiratory: No shortness of breath. No wheezing.  Gastrointestinal: No abdominal pain.  No nausea, vomiting.   Musculoskeletal: Positive for low back pain without radiation.  Skin: Negative for rash, redness, swelling, bruising, skin sores. Neurological:  Negative for numbness, weakness, tingling. No saddle paresthesias no loss of bowel control. 10-point ROS otherwise negative.  ____________________________________________   PHYSICAL EXAM:  VITAL SIGNS: ED Triage Vitals  Enc Vitals Group     BP 06/27/16 0916 (!) 157/90     Pulse Rate 06/27/16 0916 86     Resp 06/27/16 0916 18     Temp 06/27/16 0916 97.8 F (36.6 C)     Temp Source 06/27/16 0916 Oral     SpO2 06/27/16 0916 100 %     Weight 06/27/16 0917 190 lb (86.2 kg)     Height 06/27/16 0917 5\' 8"  (1.727 m)     Head Circumference --      [facility] Flow --      Pain Score 06/27/16 0935 10     Pain Loc --      Pain Edu? --      Excl. in [place]? --      Constitutional: Alert and oriented. Well appearing and in no acute distress, but in pain. Eyes: Conjunctivae are normal.  Head: Atraumatic. Cardiovascular: Normal rate, regular rhythm. Normal S1 and S2.  No murmurs, rubs, or gallops. Good peripheral circulation with 2+ pulses in bilateral lower extremities  Respiratory: Normal respiratory effort without tachypnea or retractions. Lungs CTAB with breath sounds noted in all lung fields. No wheeze, rhonchi, or rales.  Musculoskeletal: Full range of motion of all joints in the bilateral lower extremities. Decreased range of motion of the lumbar spine with full flexion due to pain. Patient is able to ambulate without significant pain. Negative bilateral straight leg raises. No SI joint tenderness. No tenderness, bony deformities or step-offs noted to palpation of the thoracic, lumbar or sacral spinal regions. Neurologic:  Normal speech and language. No gross focal neurologic deficits are appreciated. Sensation light touch grossly intact about bilateral lower extremities  Skin:  Skin is warm, dry and intact. No rash, redness, swelling, bruising, skin sores noted. Psychiatric: Mood and affect are normal. Speech and behavior are normal. Patient exhibits appropriate insight and  judgement.   ____________________________________________   LABS  None ____________________________________________  EKG  None ____________________________________________  RADIOLOGY  None ____________________________________________    PROCEDURES  Procedure(s) performed: None   Procedures   Medications  ketorolac (TORADOL) 30 MG/ML injection 30 mg (30 mg Intramuscular Given 06/27/16 1029)     ____________________________________________   INITIAL IMPRESSION / ASSESSMENT AND PLAN / ED COURSE  Pertinent labs & imaging results that were available during my care of the patient were reviewed by me and considered in my medical decision making (see chart for details).  Clinical Course     Patient's diagnosis is consistent with low back strain.  Patient will be discharged home with prescriptions for flexeril and oxycodoneTo take as directed . Patient is to follow up with her orthopedist or PCP if symptoms persist past this treatment course. Patient is given ED precautions to return to the ED for any worsening or new symptoms.    ____________________________________________  FINAL CLINICAL IMPRESSION(S) / ED DIAGNOSES  Final diagnoses:  Strain of lumbar region, initial encounter      NEW MEDICATIONS STARTED DURING THIS VISIT:  Discharge Medication List as of 06/27/2016 10:42 AM           Braxton Feathers, PA-C 06/27/16 1624    Merlyn Lot, MD 06/28/16 1208

## 2016-06-27 NOTE — ED Notes (Signed)
Pt was moving bed yesterday and injured her lower back.

## 2016-06-27 NOTE — ED Notes (Signed)
Pt informed to return if any life threatening symptoms occur.  

## 2016-07-16 DIAGNOSIS — I10 Essential (primary) hypertension: Secondary | ICD-10-CM | POA: Diagnosis not present

## 2016-07-16 DIAGNOSIS — R1011 Right upper quadrant pain: Secondary | ICD-10-CM | POA: Diagnosis not present

## 2016-07-16 DIAGNOSIS — G4700 Insomnia, unspecified: Secondary | ICD-10-CM | POA: Diagnosis not present

## 2016-07-16 DIAGNOSIS — N63 Unspecified lump in unspecified breast: Secondary | ICD-10-CM | POA: Diagnosis not present

## 2016-07-16 DIAGNOSIS — Z0001 Encounter for general adult medical examination with abnormal findings: Secondary | ICD-10-CM | POA: Diagnosis not present

## 2016-07-16 DIAGNOSIS — M130 Polyarthritis, unspecified: Secondary | ICD-10-CM | POA: Diagnosis not present

## 2016-07-17 ENCOUNTER — Other Ambulatory Visit: Payer: Self-pay | Admitting: Nurse Practitioner

## 2016-07-17 DIAGNOSIS — Z23 Encounter for immunization: Secondary | ICD-10-CM | POA: Diagnosis not present

## 2016-07-17 DIAGNOSIS — Z1231 Encounter for screening mammogram for malignant neoplasm of breast: Secondary | ICD-10-CM

## 2016-07-18 ENCOUNTER — Telehealth: Payer: Self-pay | Admitting: Gastroenterology

## 2016-07-18 NOTE — Telephone Encounter (Signed)
Colonoscopy referred by [facility]

## 2016-07-26 ENCOUNTER — Other Ambulatory Visit: Payer: Self-pay

## 2016-07-26 NOTE — Telephone Encounter (Signed)
Gastroenterology Pre-Procedure Review  Request Date:  Requesting Physician: Dr.   PATIENT REVIEW QUESTIONS: The patient responded to the following health history questions as indicated:    1. Are you having any GI issues? no 2. Do you have a personal history of Polyps? no 3. Do you have a family history of Colon Cancer or Polyps? no 4. Diabetes Mellitus? no 5. Joint replacements in the past 12 months?yes (Rt knee january left in August) 6. Major health problems in the past 3 months?no 7. Any artificial heart valves, MVP, or defibrillator?no    MEDICATIONS & ALLERGIES:    Patient reports the following regarding taking any anticoagulation/antiplatelet therapy:   Plavix, Coumadin, Eliquis, Xarelto, Lovenox, Pradaxa, Brilinta, or Effient? no Aspirin? no  Patient confirms/reports the following medications:  Current Outpatient Prescriptions  Medication Sig Dispense Refill  . AZO-CRANBERRY PO Take 2 tablets by mouth daily.    . bisoprolol-hydrochlorothiazide (ZIAC) 5-6.25 MG tablet Take 1 tablet by mouth daily.    . cyclobenzaprine (FLEXERIL) 10 MG tablet Take 1 tablet (10 mg total) by mouth 3 (three) times daily as needed for muscle spasms. 21 tablet 0  . diphenhydrAMINE (BENADRYL) 25 MG tablet Take 25 mg by mouth daily as needed for allergies.    Marland Kitchen enoxaparin (LOVENOX) 40 MG/0.4ML injection Inject 0.4 mLs (40 mg total) into the skin daily. 14 Syringe 0  . Multiple Vitamin (MULTIVITAMIN WITH MINERALS) TABS tablet Take 1 tablet by mouth daily.    Marland Kitchen omeprazole (PRILOSEC) 40 MG capsule Take 40 mg by mouth daily.    Marland Kitchen oxyCODONE (OXY IR/ROXICODONE) 5 MG immediate release tablet Take 1 tablet (5 mg total) by mouth every 4 (four) hours as needed for severe pain. 6 tablet 0  . promethazine (PHENERGAN) 25 MG tablet Take 25 mg by mouth every 6 (six) hours as needed for nausea or vomiting.     No current facility-administered medications for this visit.     Patient confirms/reports the following  allergies:  Allergies  Allergen Reactions  . Ibuprofen Other (See Comments)    Gi upset  . Tylenol [Acetaminophen] Other (See Comments)    Due to fatty liver    No orders of the defined types were placed in this encounter.   AUTHORIZATION INFORMATION Primary Insurance: 1D#: Group #:  Secondary Insurance: 1D#: Group #:  SCHEDULE INFORMATION: Date: 08/09/16 Time: Location: [place]

## 2016-07-26 NOTE — Telephone Encounter (Signed)
Screening colonoscopy at [facility] on 08/09/16 with Vicente Males. Please precert.

## 2016-07-31 DIAGNOSIS — R1011 Right upper quadrant pain: Secondary | ICD-10-CM | POA: Diagnosis not present

## 2016-07-31 DIAGNOSIS — N63 Unspecified lump in unspecified breast: Secondary | ICD-10-CM | POA: Diagnosis not present

## 2016-08-06 ENCOUNTER — Telehealth: Payer: Self-pay | Admitting: Gastroenterology

## 2016-08-06 NOTE — Telephone Encounter (Signed)
Patient called to cancel her colonoscopy for Friday due to illness. I called [facility]. Please call her next week to reschedule.

## 2016-08-09 ENCOUNTER — Ambulatory Visit: Admit: 2016-08-09 | Payer: Medicare (Managed Care) | Admitting: Gastroenterology

## 2016-08-09 SURGERY — COLONOSCOPY WITH PROPOFOL
Anesthesia: General

## 2016-08-20 ENCOUNTER — Other Ambulatory Visit: Payer: Self-pay

## 2016-08-20 NOTE — Telephone Encounter (Signed)
Pt rescheduled screening colonoscopy to 09/06/16 at [facility] with Vicente Males. Please check precert.

## 2016-08-21 ENCOUNTER — Ambulatory Visit
Admission: RE | Admit: 2016-08-21 | Discharge: 2016-08-21 | Disposition: A | Discharge status: Home / Self Care | Payer: Medicare Other | Source: Ambulatory Visit | Attending: Nurse Practitioner | Admitting: Nurse Practitioner

## 2016-08-21 DIAGNOSIS — Z1231 Encounter for screening mammogram for malignant neoplasm of breast: Secondary | ICD-10-CM

## 2016-09-02 ENCOUNTER — Other Ambulatory Visit: Payer: Self-pay | Admitting: Nurse Practitioner

## 2016-09-02 DIAGNOSIS — R1084 Generalized abdominal pain: Secondary | ICD-10-CM

## 2016-09-05 ENCOUNTER — Encounter: Payer: Self-pay | Admitting: *Deleted

## 2016-09-05 ENCOUNTER — Telehealth: Payer: Self-pay | Admitting: Gastroenterology

## 2016-09-05 NOTE — Telephone Encounter (Signed)
Patient is canceling her procedure tomorrow due to an illness. She just left the ER. Please call her in 3 weeks to reschedule. I called [facility]

## 2016-09-06 ENCOUNTER — Inpatient Hospital Stay
Admission: RE | Admit: 2016-09-06 | Discharge: 2016-09-06 | Disposition: A | Discharge status: Home / Self Care | Payer: Self-pay | Source: Ambulatory Visit | Attending: *Deleted | Admitting: *Deleted

## 2016-09-06 ENCOUNTER — Encounter: Admission: RE | Payer: Self-pay | Source: Ambulatory Visit

## 2016-09-06 ENCOUNTER — Ambulatory Visit: Admission: RE | Admit: 2016-09-06 | Payer: Medicare Other | Source: Ambulatory Visit | Admitting: Gastroenterology

## 2016-09-06 ENCOUNTER — Other Ambulatory Visit: Payer: Self-pay | Admitting: *Deleted

## 2016-09-06 DIAGNOSIS — Z1231 Encounter for screening mammogram for malignant neoplasm of breast: Secondary | ICD-10-CM

## 2016-09-06 SURGERY — COLONOSCOPY WITH PROPOFOL
Anesthesia: General

## 2016-09-10 ENCOUNTER — Ambulatory Visit
Admission: RE | Admit: 2016-09-10 | Discharge: 2016-09-10 | Disposition: A | Discharge status: Home / Self Care | Payer: Medicare Other | Source: Ambulatory Visit | Attending: Nurse Practitioner | Admitting: Nurse Practitioner

## 2016-09-10 DIAGNOSIS — R1012 Left upper quadrant pain: Secondary | ICD-10-CM | POA: Diagnosis not present

## 2016-09-10 DIAGNOSIS — R1900 Intra-abdominal and pelvic swelling, mass and lump, unspecified site: Secondary | ICD-10-CM | POA: Insufficient documentation

## 2016-09-10 DIAGNOSIS — R1084 Generalized abdominal pain: Secondary | ICD-10-CM | POA: Insufficient documentation

## 2016-09-10 LAB — POCT I-STAT CREATININE: Creatinine, Ser: 1.2 mg/dL — ABNORMAL HIGH (ref 0.44–1.00)

## 2016-09-10 MED ORDER — IOPAMIDOL (ISOVUE-370) INJECTION 76%
100.0000 mL | Freq: Once | INTRAVENOUS | Status: AC | PRN
  Administered 2016-09-10: 100 mL via INTRAVENOUS

## 2016-09-23 DIAGNOSIS — G4700 Insomnia, unspecified: Secondary | ICD-10-CM | POA: Diagnosis not present

## 2016-09-23 DIAGNOSIS — D649 Anemia, unspecified: Secondary | ICD-10-CM | POA: Diagnosis not present

## 2016-09-23 DIAGNOSIS — I10 Essential (primary) hypertension: Secondary | ICD-10-CM | POA: Diagnosis not present

## 2016-09-23 DIAGNOSIS — M170 Bilateral primary osteoarthritis of knee: Secondary | ICD-10-CM | POA: Diagnosis not present

## 2016-09-23 DIAGNOSIS — M549 Dorsalgia, unspecified: Secondary | ICD-10-CM | POA: Diagnosis not present

## 2016-10-04 ENCOUNTER — Emergency Department
Admission: EM | Admit: 2016-10-04 | Discharge: 2016-10-04 | Disposition: A | Discharge status: Home / Self Care | Payer: Medicare Other | Attending: Emergency Medicine | Admitting: Emergency Medicine

## 2016-10-04 ENCOUNTER — Emergency Department: Payer: Medicare Other

## 2016-10-04 DIAGNOSIS — R079 Chest pain, unspecified: Secondary | ICD-10-CM | POA: Diagnosis not present

## 2016-10-04 DIAGNOSIS — Z79899 Other long term (current) drug therapy: Secondary | ICD-10-CM | POA: Insufficient documentation

## 2016-10-04 DIAGNOSIS — R0602 Shortness of breath: Secondary | ICD-10-CM | POA: Diagnosis not present

## 2016-10-04 DIAGNOSIS — R1013 Epigastric pain: Secondary | ICD-10-CM | POA: Insufficient documentation

## 2016-10-04 DIAGNOSIS — F17210 Nicotine dependence, cigarettes, uncomplicated: Secondary | ICD-10-CM | POA: Insufficient documentation

## 2016-10-04 DIAGNOSIS — I10 Essential (primary) hypertension: Secondary | ICD-10-CM | POA: Insufficient documentation

## 2016-10-04 DIAGNOSIS — Z96653 Presence of artificial knee joint, bilateral: Secondary | ICD-10-CM | POA: Insufficient documentation

## 2016-10-04 LAB — CBC WITH DIFFERENTIAL/PLATELET
BASOS ABS: 0 10*3/uL (ref 0–0.1)
Basophils Relative: 0 %
EOS PCT: 2 %
Eosinophils Absolute: 0.2 10*3/uL (ref 0–0.7)
HEMATOCRIT: 39 % (ref 35.0–47.0)
Hemoglobin: 13.1 g/dL (ref 12.0–16.0)
LYMPHS ABS: 3.4 10*3/uL (ref 1.0–3.6)
LYMPHS PCT: 28 %
MCH: 26.7 pg (ref 26.0–34.0)
MCHC: 33.6 g/dL (ref 32.0–36.0)
MCV: 79.4 fL — AB (ref 80.0–100.0)
Monocytes Absolute: 0.6 10*3/uL (ref 0.2–0.9)
Monocytes Relative: 5 %
NEUTROS ABS: 7.7 10*3/uL — AB (ref 1.4–6.5)
Neutrophils Relative %: 65 %
Platelets: 306 10*3/uL (ref 150–440)
RBC: 4.91 MIL/uL (ref 3.80–5.20)
RDW: 16.9 % — ABNORMAL HIGH (ref 11.5–14.5)
WBC: 12 10*3/uL — AB (ref 3.6–11.0)

## 2016-10-04 LAB — COMPREHENSIVE METABOLIC PANEL
ALBUMIN: 4.1 g/dL (ref 3.5–5.0)
ALK PHOS: 111 U/L (ref 38–126)
ALT: 16 U/L (ref 14–54)
AST: 22 U/L (ref 15–41)
Anion gap: 9 (ref 5–15)
BUN: 18 mg/dL (ref 6–20)
CALCIUM: 9.7 mg/dL (ref 8.9–10.3)
CHLORIDE: 102 mmol/L (ref 101–111)
CO2: 27 mmol/L (ref 22–32)
CREATININE: 0.95 mg/dL (ref 0.44–1.00)
GFR calc Af Amer: >60 mL/min (ref >60)
GFR calc non Af Amer: >60 mL/min (ref >60)
GLUCOSE: 88 mg/dL (ref 65–99)
Potassium: 3.8 mmol/L (ref 3.5–5.1)
SODIUM: 138 mmol/L (ref 135–145)
Total Bilirubin: 0.5 mg/dL (ref 0.3–1.2)
Total Protein: 8.5 g/dL — ABNORMAL HIGH (ref 6.5–8.1)

## 2016-10-04 LAB — LIPASE, BLOOD: LIPASE: 10 U/L — AB (ref 11–51)

## 2016-10-04 LAB — PROTIME-INR
INR: 0.89
Prothrombin Time: 12 seconds (ref 11.4–15.2)

## 2016-10-04 LAB — TROPONIN I: Troponin I: <0.03 ng/mL (ref <0.03)

## 2016-10-04 LAB — BRAIN NATRIURETIC PEPTIDE: B NATRIURETIC PEPTIDE 5: 101 pg/mL — AB (ref 0.0–100.0)

## 2016-10-04 MED ORDER — DICYCLOMINE HCL 20 MG PO TABS: 20.0000 mg | ORAL_TABLET | Freq: Three times a day (TID) | ORAL | 0 refills | Status: AC | PRN

## 2016-10-04 MED ORDER — FAMOTIDINE 200 MG/20ML IV SOLN: 40.0000 mg | Freq: Once | INTRAVENOUS | Status: DC

## 2016-10-04 MED ORDER — GI COCKTAIL ~~LOC~~
30.0000 mL | Freq: Once | ORAL | Status: AC
  Administered 2016-10-04: 30 mL via ORAL
  Filled 2016-10-04: qty 30

## 2016-10-04 MED ORDER — ESMOLOL HCL-SODIUM CHLORIDE 2000 MG/100ML IV SOLN
25.0000 ug/kg/min | Freq: Once | INTRAVENOUS | Status: DC
  Filled 2016-10-04: qty 100

## 2016-10-04 MED ORDER — ONDANSETRON HCL 4 MG/2ML IJ SOLN
4.0000 mg | Freq: Once | INTRAMUSCULAR | Status: AC
  Administered 2016-10-04: 4 mg via INTRAVENOUS

## 2016-10-04 MED ORDER — MORPHINE SULFATE (PF) 4 MG/ML IV SOLN
6.0000 mg | Freq: Once | INTRAVENOUS | Status: AC
  Administered 2016-10-04: 6 mg via INTRAVENOUS
  Filled 2016-10-04: qty 2

## 2016-10-04 MED ORDER — ONDANSETRON HCL 4 MG/2ML IJ SOLN
INTRAMUSCULAR | Status: AC
  Administered 2016-10-04: 4 mg via INTRAVENOUS
  Filled 2016-10-04: qty 2

## 2016-10-04 MED ORDER — ONDANSETRON HCL 4 MG PO TABS: 4.0000 mg | ORAL_TABLET | Freq: Every day | ORAL | 1 refills | Status: AC | PRN

## 2016-10-04 MED ORDER — IOPAMIDOL (ISOVUE-370) INJECTION 76%
125.0000 mL | Freq: Once | INTRAVENOUS | Status: AC | PRN
  Administered 2016-10-04: 125 mL via INTRAVENOUS

## 2016-10-04 MED ORDER — FAMOTIDINE 40 MG PO TABS: 40.0000 mg | ORAL_TABLET | Freq: Every evening | ORAL | 1 refills | Status: AC

## 2016-10-04 MED ORDER — FAMOTIDINE IN NACL 20-0.9 MG/50ML-% IV SOLN
20.0000 mg | Freq: Once | INTRAVENOUS | Status: AC
  Administered 2016-10-04: 20 mg via INTRAVENOUS
  Filled 2016-10-04: qty 50

## 2016-10-04 MED ORDER — DICYCLOMINE HCL 10 MG PO CAPS
20.0000 mg | ORAL_CAPSULE | Freq: Once | ORAL | Status: AC
  Administered 2016-10-04: 20 mg via ORAL
  Filled 2016-10-04: qty 2

## 2016-10-04 NOTE — ED Notes (Signed)
Pt educated regarding acid reflux and how meds that are being administered can help with this. Pt asking for further pain medication. Pt explained she could not take anything with tylenol in it due to her fatty liver per PCP. Pt states she normally takes oxy-ir for pain at home but does not have any.

## 2016-10-04 NOTE — ED Triage Notes (Signed)
Pt to ED via [facility] c/o chest pain. Per EMS pt began having centralized chest pain at 8 am this morning that became progressively worse over the day. Pt is alert and oriented at this time.

## 2016-10-04 NOTE — Discharge Instructions (Signed)
Please return to the emergency department for any new or worsening symptoms such as recurrent pain, fevers, chills, if you cannot eat or drink, or for any other concerns. Otherwise follow up with her primary care physician within one week for recheck.  It was a pleasure to take care of you today, and thank you for coming to our emergency department.  If you have any questions or concerns before leaving please ask the nurse to grab me and I'm more than happy to go through your aftercare instructions again.  If you were prescribed any opioid pain medication today such as Norco, Vicodin, Percocet, morphine, hydrocodone, or oxycodone please make sure you do not drive when you are taking this medication as it can alter your ability to drive safely.  If you have any concerns once you are home that you are not improving or are in fact getting worse before you can make it to your follow-up appointment, please do not hesitate to call 911 and come back for further evaluation.  Darel Hong MD  Results for orders placed or performed during the hospital encounter of 10/04/16  Troponin I  Result Value Ref Range   Troponin I <0.03 <0.03 ng/mL  Brain natriuretic peptide  Result Value Ref Range   B Natriuretic Peptide 101.0 (H) 0.0 - 100.0 pg/mL  CBC with Differential  Result Value Ref Range   WBC 12.0 (H) 3.6 - 11.0 K/uL   RBC 4.91 3.80 - 5.20 MIL/uL   Hemoglobin 13.1 12.0 - 16.0 g/dL   HCT 39.0 35.0 - 47.0 %   MCV 79.4 (L) 80.0 - 100.0 fL   MCH 26.7 26.0 - 34.0 pg   MCHC 33.6 32.0 - 36.0 g/dL   RDW 16.9 (H) 11.5 - 14.5 %   Platelets 306 150 - 440 K/uL   Neutrophils Relative % 65 %   Neutro Abs 7.7 (H) 1.4 - 6.5 K/uL   Lymphocytes Relative 28 %   Lymphs Abs 3.4 1.0 - 3.6 K/uL   Monocytes Relative 5 %   Monocytes Absolute 0.6 0.2 - 0.9 K/uL   Eosinophils Relative 2 %   Eosinophils Absolute 0.2 0 - 0.7 K/uL   Basophils Relative 0 %   Basophils Absolute 0.0 0 - 0.1 K/uL  Protime-INR  Result  Value Ref Range   Prothrombin Time 12.0 11.4 - 15.2 seconds   INR 0.89   Lipase, blood  Result Value Ref Range   Lipase 10 (L) 11 - 51 U/L  Comprehensive metabolic panel  Result Value Ref Range   Sodium 138 135 - 145 mmol/L   Potassium 3.8 3.5 - 5.1 mmol/L   Chloride 102 101 - 111 mmol/L   CO2 27 22 - 32 mmol/L   Glucose, Bld 88 65 - 99 mg/dL   BUN 18 6 - 20 mg/dL   Creatinine, Ser 0.95 0.44 - 1.00 mg/dL   Calcium 9.7 8.9 - 10.3 mg/dL   Total Protein 8.5 (H) 6.5 - 8.1 g/dL   Albumin 4.1 3.5 - 5.0 g/dL   AST 22 15 - 41 U/L   ALT 16 14 - 54 U/L   Alkaline Phosphatase 111 38 - 126 U/L   Total Bilirubin 0.5 0.3 - 1.2 mg/dL   GFR calc non Af Amer >60 >60 mL/min   GFR calc Af Amer >60 >60 mL/min   Anion gap 9 5 - 15   Ct Abdomen W Contrast  Result Date: 09/10/2016 CLINICAL DATA:  LEFT upper quadrant pain.  Mid  epigastric pain. EXAM: CT ABDOMEN WITH CONTRAST TECHNIQUE: Multidetector CT imaging of the abdomen was performed using the standard protocol following bolus administration of intravenous contrast. CONTRAST:  Seventy-five mL Isovue COMPARISON:  09/19/2008 FINDINGS: Lower chest:  Lung bases are clear. Hepatobiliary: No focal hepatic lesion. Normal gallbladder. No duct dilatation. Pancreas: Normal pancreatic parenchymal intensity. No ductal dilatation or inflammation. Spleen: Normal spleen. Adrenals/urinary tract: Adrenal glands and kidneys are normal. Stomach/Bowel: Stomach and limited of the small bowel is unremarkable Vascular/Lymphatic: Abdominal aorta is normal caliber with atherosclerotic calcification. There is no retroperitoneal or periportal lymphadenopathy. No pelvic lymphadenopathy. Musculoskeletal: No aggressive osseous lesion IMPRESSION: 1. No explanation for LEFT upper quadrant pain. 2. Normal spleen and pancreas. Electronically Signed   By: Suzy Bouchard M.D.   On: 09/10/2016 16:24   Dg Chest Port 1 View  Result Date: 10/04/2016 CLINICAL DATA:  Progressively worsening  central chest pain today. Shortness of breath. History of hypertension and reflux disease. EXAM: PORTABLE CHEST 1 VIEW COMPARISON:  Chest radiograph October 04, 2016 at 1149 hours FINDINGS: Cardiac silhouette is upper limits of normal in size. Mediastinal silhouette is normal, calcified aortic knob. No pleural effusion or focal consolidation. No pneumothorax. Soft tissue planes and included osseous structures are unchanged. IMPRESSION: Borderline cardiomegaly, no acute pulmonary process. Electronically Signed   By: Elon Alas M.D.   On: 10/04/2016 19:19   Ct Angio Chest/abd/pel For Dissection W And/or W/wo  Result Date: 10/04/2016 CLINICAL DATA:  Central chest pain since 8 a.m. this morning worsening over the course of the day. EXAM: CT ANGIOGRAPHY CHEST, ABDOMEN AND PELVIS TECHNIQUE: Multidetector CT imaging through the chest, abdomen and pelvis was performed using the standard protocol during bolus administration of intravenous contrast. Multiplanar reconstructed images and MIPs were obtained and reviewed to evaluate the vascular anatomy. CONTRAST:  100 cc Isovue 370 IV COMPARISON:  None. FINDINGS: CTA CHEST FINDINGS Cardiovascular: Mild cardiomegaly. There is coronary arteriosclerosis at the origin of the circumflex and mid LAD. Trace pericardial fluid or thickening. Mitral valvular calcifications. Normal takeoff of the great vessels. No aortic aneurysm or dissection. The ascending aorta is slightly ectatic and measures up to 3.6 cm in diameter. Aortic atherosclerosis is noted at the arch. No acute large central pulmonary embolus. Mediastinum/Nodes: No enlarged mediastinal, hilar, or axillary lymph nodes. Thyroid gland, trachea, and esophagus demonstrate no significant findings. Small hiatal hernia. Lungs/Pleura: Lungs are clear. No pleural effusion or pneumothorax. Musculoskeletal: No chest wall abnormality. No acute or significant osseous findings. Bone island of the left ninth rib. Review of the MIP  images confirms the above findings. CTA ABDOMEN AND PELVIS FINDINGS VASCULAR Aorta: Normal caliber aorta without aneurysm, dissection, vasculitis or significant stenosis. Celiac: Patent without evidence of aneurysm, dissection, or vasculitis. Minimal calcific plaque at the origin without significant stenosis. SMA: Patent without evidence of aneurysm, dissection, vasculitis or significant stenosis. Renals: Both renal arteries are patent without evidence of aneurysm, dissection, vasculitis, fibromuscular dysplasia or significant stenosis. Single renal arteries to both kidneys. IMA: Patent Inflow: Mild atherosclerotic plaquing along the proximal left common iliac artery without significant stenosis. No aneurysmal dilatation. Veins: Hypodensity within the main portal vein may be due to flow related artifact. Nonocclusive thrombus is not entirely excluded. Review of the MIP images confirms the above findings. NON-VASCULAR Hepatobiliary: No focal liver abnormality is seen. No gallstones, gallbladder wall thickening, or biliary dilatation. Pancreas: Unremarkable. No pancreatic ductal dilatation or surrounding inflammatory changes. Mild ectasia of the distal pancreatic duct to 3-4 mm. No focal mass  or obstructing calculus. Spleen: Normal in size without focal abnormality. Adrenals/Urinary Tract: Normal bilateral adrenal glands. Minimal cortical scarring of the lower pole the right kidney. No obstructive uropathy. Normal urinary bladder. Stomach/Bowel: Stomach is within normal limits. Appendix is not definitively identified but no pericecal inflammation is noted. There is a moderate amount of fecal retention within large bowel. No evidence of bowel wall thickening, distention, or inflammatory changes. Scattered colonic diverticulosis is noted along the descending and sigmoid colon. Lymphatic: No lymphadenopathy. Reproductive: Hysterectomy.  No adnexal mass. Other: No abdominal wall hernia or abnormality. No abdominopelvic  ascites. Musculoskeletal: Degenerative disc disease L4-5 with lower lumbar facet arthropathy. Mild dextroconvex curvature at the thoracolumbar junction. No spondylolysis nor spondylolisthesis. Sclerotic density in the left ischium may represent a bone island. There is osteoarthritis of both SI joints with spurring. Review of the MIP images confirms the above findings. IMPRESSION: 1. No aortic dissection or acute pulmonary embolus. Aortic atherosclerosis is noted with mild ectasia of the ascending aorta up to 3.6 cm. 2. No acute abnormality or significant stenosis of the branch vessels off the abdominal aorta. 3. Filling defect in the main portal vein likely related to flow related artifact. Nonocclusive thrombus is not entirely excluded. 4. Mild cortical scarring of the lower pole the right kidney. 5. Degenerative changes are seen of the dorsal spine. Electronically Signed   By: Ashley Royalty M.D.   On: 10/04/2016 19:56   Mm Outside Films Mammo  Result Date: 09/06/2016 This examination belongs to an outside facility and is stored here for comparison purposes only. Contact the originating outside institution for any associated report or interpretation.

## 2016-10-04 NOTE — ED Provider Notes (Signed)
[facility] Emergency Department Provider Note  ____________________________________________   First MD Initiated Contact with Patient 10/04/16 1857     (approximate)  I have reviewed the triage vital signs and the nursing notes.   HISTORY  Chief Complaint Chest Pain   HPI Katherine Hartman is a 66 y.o. female who comes to the emergency department via EMS for sudden onset severe epigastric and lower chest pain radiating straight to her back that began an hour prior to arrival. The past 2-3 days she has had some chest discomfort but it is not been as severe as it is right now. Her only past medical history is hypertension. She's never had a heart attack.   Past Medical History:  Diagnosis Date  . Arthritis   . GERD (gastroesophageal reflux disease)   . Hypertension   . 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 Primary localized osteoarthritis of left knee 03/05/2016  . Primary osteoarthritis of right knee 08/24/2015    Past Surgical History:  Procedure Laterality Date  . ABDOMINAL HYSTERECTOMY    . BLADDER SUSPENSION N/A   . JOINT REPLACEMENT Right 2017   knee  . 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 TOTAL KNEE ARTHROPLASTY Left 03/05/2016   Procedure: TOTAL KNEE ARTHROPLASTY;  Surgeon: Hessie Knows, MD;  Location: [facility] ORS;  Service: Orthopedics;  Laterality: Left;  . TUBAL LIGATION      Prior to Admission medications   Medication Sig Start Date End Date Taking? Authorizing Provider  bisoprolol-hydrochlorothiazide (ZIAC) 5-6.25 MG tablet Take 1 tablet by mouth daily. 11/23/15  Yes Historical Provider, MD  cyclobenzaprine (FLEXERIL) 10 MG tablet Take 1 tablet (10 mg total) by mouth 3 (three) times daily  as needed for muscle spasms. 06/27/16  Yes Jami L Hagler, PA-C  diphenhydrAMINE (BENADRYL) 25 MG tablet Take 25 mg by mouth daily as needed for allergies.   Yes Historical Provider, MD  meloxicam (MOBIC) 7.5 MG tablet Take 7.5 mg by mouth 2 (two) times daily. 09/15/16  Yes Historical Provider, MD  oxyCODONE (OXY IR/ROXICODONE) 5 MG immediate release tablet Take 1 tablet (5 mg total) by mouth every 4 (four) hours as needed for severe pain. 06/27/16  Yes Jami L Hagler, PA-C  zolpidem (AMBIEN) 5 MG tablet Take 5 mg by mouth at bedtime as needed. 09/15/16  Yes Historical Provider, MD  AZO-CRANBERRY PO Take 2 tablets by mouth daily.    Historical Provider, MD  dicyclomine (BENTYL) 20 MG tablet Take 1 tablet (20 mg total) by mouth 3 (three) times daily as needed for spasms. 10/04/16 10/04/17  Darel Hong, MD  enoxaparin (LOVENOX) 40 MG/0.4ML injection Inject 0.4 mLs (40 mg total) into the skin daily. Patient not taking: Reported on 10/04/2016 03/07/16 03/22/16  Duanne Guess, PA-C  famotidine (PEPCID) 40 MG tablet Take 1 tablet (40 mg total) by mouth every evening. 10/04/16 10/04/17  Darel Hong, MD  Multiple Vitamin (MULTIVITAMIN WITH MINERALS) TABS tablet Take 1 tablet by mouth daily.    Historical Provider, MD  omeprazole (PRILOSEC) 40 MG capsule Take 40 mg by mouth daily.    Historical Provider, MD  ondansetron (ZOFRAN) 4 MG tablet Take 1 tablet (4 mg total) by mouth daily as needed for nausea or  vomiting. 10/04/16 10/04/17  Darel Hong, MD  promethazine (PHENERGAN) 25 MG tablet Take 25 mg by mouth every 6 (six) hours as needed for nausea or vomiting.    Historical Provider, MD    Allergies Ibuprofen and Tylenol [acetaminophen]  Family History  Problem Relation Age of Onset  . Cancer Mother   . Hypertension Mother   . Diabetes Mother   . Breast cancer Paternal Aunt   . Breast cancer Paternal Aunt     Social History Social History  Substance Use Topics  . Smoking status: Current Every Day Smoker     Packs/day: 0.25    Years: 50.00    Types: Cigarettes  . Smokeless tobacco: Never Used  . Alcohol use No     Comment: stopped in Sept. 2016    Review of Systems Constitutional: No fever/chills Eyes: No visual changes. ENT: No sore throat. Cardiovascular: Positive chest pain. Respiratory: Positive shortness of breath. Gastrointestinal:positive abdominal pain.  Positive nausea, no vomiting.  No diarrhea.  No constipation. Genitourinary: Negative for dysuria. Musculoskeletal: Negative for back pain. Skin: Negative for rash. Neurological: Negative for headaches, focal weakness or numbness.  10-point ROS otherwise negative.  ____________________________________________   PHYSICAL EXAM:  VITAL SIGNS: ED Triage Vitals [10/04/16 1856]  Enc Vitals Group     BP (!) 184/85     Pulse Rate 82     Resp      Temp 98.1 F (36.7 C)     Temp Source Oral     SpO2 100 %     Weight 235 lb (106.6 kg)     Height 5\' 9"  (1.753 m)     Head Circumference      Peak Flow      Pain Score      Pain Loc      Pain Edu?      Excl. in [place]?     Constitutional: Alert and oriented x 4 Appears extremely uncomfortable hyperventilating diaphoretic and ill appearing Eyes: PERRL EOMI. Head: Atraumatic. Nose: No congestion/rhinnorhea. Mouth/Throat: No trismus Neck: No stridor.   Cardiovascular: Normal rate, regular rhythm. Grossly normal heart sounds.  Good peripheral circulation. Respiratory: Normal respiratory effort.  No retractions. Lungs CTAB and moving good air Gastrointestinal: Soft nondistended nontender no rebound no guarding no peritonitis no McBurney's tenderness negative Rovsing's no costovertebral tenderness negative Murphy's Musculoskeletal: No lower extremity edema   Neurologic:  Normal speech and language. No gross focal neurologic deficits are appreciated. Skin:  Skin is warm, dry and intact. No rash noted. Psychiatric: Mood and affect are normal. Speech and behavior are  normal.  ____________________________________________   LABS (all labs ordered are listed, but only abnormal results are displayed)  Labs Reviewed  BRAIN NATRIURETIC PEPTIDE - Abnormal; Notable for the following:       Result Value   B Natriuretic Peptide 101.0 (*)    All other components within normal limits  CBC WITH DIFFERENTIAL/PLATELET - Abnormal; Notable for the following:    WBC 12.0 (*)    MCV 79.4 (*)    RDW 16.9 (*)    Neutro Abs 7.7 (*)    All other components within normal limits  LIPASE, BLOOD - Abnormal; Notable for the following:    Lipase 10 (*)    All other components within normal limits  COMPREHENSIVE METABOLIC PANEL - Abnormal; Notable for the following:    Total Protein 8.5 (*)    All other components within normal limits  TROPONIN I  PROTIME-INR  URINALYSIS, COMPLETE (  UACMP) WITH MICROSCOPIC   ____________________________________________  EKG  ED ECG REPORT I, Darel Hong, the attending physician, personally viewed and interpreted this ECG.  Date: 10/04/2016 Rate: 80 Rhythm: normal sinus rhythm QRS Axis: normal Intervals: normal ST/T Wave abnormalities: normal Conduction Disturbances: Left bundle branch block Narrative Interpretation: Abnormal _______________________________________  RADIOLOGY  CT with no acute disease. No dissection ____________________________________________   PROCEDURES  Procedure(s) performed: no  Procedures  Critical Care performed: no  ____________________________________________   INITIAL IMPRESSION / ASSESSMENT AND PLAN / ED COURSE  Pertinent labs & imaging results that were available during my care of the patient were reviewed by me and considered in my medical decision making (see chart for details).  On arrival the patient was extremely uncomfortable appearing with severe sudden onset epigastric and chest pain radiating to her back with a left bundle branch block and significant hypertension. The  most concerning diagnosis in first was aortic dissection. IV established, morphine given for pain control and esmolol given upfront given my high index of suspicion. I called CT scan to get her over before a creatinine was back as there is no creatinine that would stop me from scanning.     Fortunately the patient's CT scan is negative for dissection or pulmonary embolism. After seeing this I gave her a GI cocktail which nearly completely resolved her pain. She likely has esophageal spasm along with gastric reflux swell treat her with dicyclomine and famotidine and strict return precautions. She is medically stable for outpatient management. ____________________________________________   FINAL CLINICAL IMPRESSION(S) / ED DIAGNOSES  Final diagnoses:  Epigastric pain      NEW MEDICATIONS STARTED DURING THIS VISIT:  New Prescriptions   DICYCLOMINE (BENTYL) 20 MG TABLET    Take 1 tablet (20 mg total) by mouth 3 (three) times daily as needed for spasms.   FAMOTIDINE (PEPCID) 40 MG TABLET    Take 1 tablet (40 mg total) by mouth every evening.   ONDANSETRON (ZOFRAN) 4 MG TABLET    Take 1 tablet (4 mg total) by mouth daily as needed for nausea or vomiting.     Note:  This document was prepared using Dragon voice recognition software and may include unintentional dictation errors.     Darel Hong, MD 10/04/16 2052

## 2016-10-08 NOTE — Telephone Encounter (Signed)
Pt was scheduled for a screening colonoscopy but had to cancel due to illness. She went to the ER on 10/04/16 and was dx with GERD. She stated medication she was given was not helping. She would like to add the EGD to her procedure as well. Would you be okay with this or bring her in for an appt first?

## 2016-10-08 NOTE — Telephone Encounter (Signed)
That would be fine 

## 2016-10-08 NOTE — Telephone Encounter (Signed)
Pt would like to reschedule EGD and Colonoscopy at [facility] on 10/29/16. Pt requested new paperwork to be mailed with new date. Pt already has medication.

## 2016-10-09 ENCOUNTER — Other Ambulatory Visit: Payer: Self-pay

## 2016-10-09 DIAGNOSIS — R12 Heartburn: Secondary | ICD-10-CM

## 2016-10-09 DIAGNOSIS — Z1211 Encounter for screening for malignant neoplasm of colon: Secondary | ICD-10-CM

## 2016-10-28 ENCOUNTER — Encounter: Payer: Self-pay | Admitting: *Deleted

## 2016-10-29 ENCOUNTER — Ambulatory Visit: Payer: Medicare Other | Admitting: Anesthesiology

## 2016-10-29 ENCOUNTER — Ambulatory Visit
Admission: RE | Admit: 2016-10-29 | Discharge: 2016-10-29 | Disposition: A | Discharge status: Home / Self Care | Payer: Medicare Other | Source: Ambulatory Visit | Attending: Gastroenterology | Admitting: Gastroenterology

## 2016-10-29 ENCOUNTER — Encounter: Payer: Self-pay | Admitting: Anesthesiology

## 2016-10-29 ENCOUNTER — Encounter
Admission: RE | Disposition: A | Discharge status: Home / Self Care | Payer: Self-pay | Source: Ambulatory Visit | Attending: Gastroenterology

## 2016-10-29 DIAGNOSIS — K259 Gastric ulcer, unspecified as acute or chronic, without hemorrhage or perforation: Secondary | ICD-10-CM | POA: Insufficient documentation

## 2016-10-29 DIAGNOSIS — Z6834 Body mass index (BMI) 34.0-34.9, adult: Secondary | ICD-10-CM | POA: Diagnosis not present

## 2016-10-29 DIAGNOSIS — K2900 Acute gastritis without bleeding: Secondary | ICD-10-CM | POA: Diagnosis not present

## 2016-10-29 DIAGNOSIS — K635 Polyp of colon: Secondary | ICD-10-CM | POA: Diagnosis not present

## 2016-10-29 DIAGNOSIS — Z79899 Other long term (current) drug therapy: Secondary | ICD-10-CM | POA: Insufficient documentation

## 2016-10-29 DIAGNOSIS — Z8249 Family history of ischemic heart disease and other diseases of the circulatory system: Secondary | ICD-10-CM | POA: Diagnosis not present

## 2016-10-29 DIAGNOSIS — M1990 Unspecified osteoarthritis, unspecified site: Secondary | ICD-10-CM | POA: Diagnosis not present

## 2016-10-29 DIAGNOSIS — K640 First degree hemorrhoids: Secondary | ICD-10-CM | POA: Diagnosis not present

## 2016-10-29 DIAGNOSIS — I447 Left bundle-branch block, unspecified: Secondary | ICD-10-CM | POA: Diagnosis not present

## 2016-10-29 DIAGNOSIS — Z803 Family history of malignant neoplasm of breast: Secondary | ICD-10-CM | POA: Insufficient documentation

## 2016-10-29 DIAGNOSIS — Z90710 Acquired absence of both cervix and uterus: Secondary | ICD-10-CM | POA: Diagnosis not present

## 2016-10-29 DIAGNOSIS — F410 Panic disorder [episodic paroxysmal anxiety] without agoraphobia: Secondary | ICD-10-CM | POA: Diagnosis not present

## 2016-10-29 DIAGNOSIS — K2970 Gastritis, unspecified, without bleeding: Secondary | ICD-10-CM | POA: Diagnosis not present

## 2016-10-29 DIAGNOSIS — R12 Heartburn: Secondary | ICD-10-CM

## 2016-10-29 DIAGNOSIS — K2960 Other gastritis without bleeding: Secondary | ICD-10-CM | POA: Diagnosis not present

## 2016-10-29 DIAGNOSIS — K449 Diaphragmatic hernia without obstruction or gangrene: Secondary | ICD-10-CM | POA: Insufficient documentation

## 2016-10-29 DIAGNOSIS — Z833 Family history of diabetes mellitus: Secondary | ICD-10-CM | POA: Diagnosis not present

## 2016-10-29 DIAGNOSIS — R1013 Epigastric pain: Secondary | ICD-10-CM

## 2016-10-29 DIAGNOSIS — E669 Obesity, unspecified: Secondary | ICD-10-CM | POA: Insufficient documentation

## 2016-10-29 DIAGNOSIS — Z886 Allergy status to analgesic agent status: Secondary | ICD-10-CM | POA: Insufficient documentation

## 2016-10-29 DIAGNOSIS — K219 Gastro-esophageal reflux disease without esophagitis: Secondary | ICD-10-CM | POA: Diagnosis not present

## 2016-10-29 DIAGNOSIS — F17210 Nicotine dependence, cigarettes, uncomplicated: Secondary | ICD-10-CM | POA: Insufficient documentation

## 2016-10-29 DIAGNOSIS — D125 Benign neoplasm of sigmoid colon: Secondary | ICD-10-CM | POA: Insufficient documentation

## 2016-10-29 DIAGNOSIS — I10 Essential (primary) hypertension: Secondary | ICD-10-CM | POA: Diagnosis not present

## 2016-10-29 DIAGNOSIS — Z1211 Encounter for screening for malignant neoplasm of colon: Secondary | ICD-10-CM | POA: Diagnosis not present

## 2016-10-29 DIAGNOSIS — Z96653 Presence of artificial knee joint, bilateral: Secondary | ICD-10-CM | POA: Insufficient documentation

## 2016-10-29 HISTORY — PX: ESOPHAGOGASTRODUODENOSCOPY (EGD) WITH PROPOFOL: SHX5813

## 2016-10-29 HISTORY — PX: COLONOSCOPY WITH PROPOFOL: SHX5780

## 2016-10-29 SURGERY — COLONOSCOPY WITH PROPOFOL
Anesthesia: General

## 2016-10-29 MED ORDER — PROPOFOL 500 MG/50ML IV EMUL
INTRAVENOUS | Status: AC
  Filled 2016-10-29: qty 50

## 2016-10-29 MED ORDER — LIDOCAINE HCL (PF) 2 % IJ SOLN
INTRAMUSCULAR | Status: DC | PRN
  Administered 2016-10-29: 30 mg via INTRADERMAL

## 2016-10-29 MED ORDER — GLYCOPYRROLATE 0.2 MG/ML IJ SOLN
INTRAMUSCULAR | Status: AC
  Filled 2016-10-29: qty 1

## 2016-10-29 MED ORDER — GLYCOPYRROLATE 0.2 MG/ML IJ SOLN
INTRAMUSCULAR | Status: DC | PRN
  Administered 2016-10-29: 0.2 mg via INTRAVENOUS

## 2016-10-29 MED ORDER — PROPOFOL 10 MG/ML IV BOLUS
INTRAVENOUS | Status: DC | PRN
  Administered 2016-10-29: 40 mg via INTRAVENOUS
  Administered 2016-10-29: 30 mg via INTRAVENOUS
  Administered 2016-10-29: 50 mg via INTRAVENOUS

## 2016-10-29 MED ORDER — PROPOFOL 500 MG/50ML IV EMUL
INTRAVENOUS | Status: DC | PRN
  Administered 2016-10-29: 150 ug/kg/min via INTRAVENOUS

## 2016-10-29 MED ORDER — PROPOFOL 10 MG/ML IV BOLUS
INTRAVENOUS | Status: AC
  Filled 2016-10-29: qty 20

## 2016-10-29 MED ORDER — SODIUM CHLORIDE 0.9 % IV SOLN
INTRAVENOUS | Status: DC
  Administered 2016-10-29: 1000 mL via INTRAVENOUS

## 2016-10-29 NOTE — Op Note (Signed)
[facility] Gastroenterology Patient Name: Katherine Hartman Procedure Date: 10/29/2016 10:03 AM MRN: [id number] Account #: [id number] Date of Birth: 1950/09/26 Admit Type: Outpatient Age: 66 Room: [facility] ENDO ROOM 4 Gender: Female Note Status: Finalized Procedure:            Colonoscopy Indications:          Screening for colorectal malignant neoplasm Providers:            Lucilla Lame MD, MD Referring MD:         No Local Md, MD (Referring MD) Medicines:            Propofol per Anesthesia Complications:        No immediate complications. Procedure:            Pre-Anesthesia Assessment:                       - Prior to the procedure, a History and Physical was                        performed, and patient medications and allergies were                        reviewed. The patient's tolerance of previous                        anesthesia was also reviewed. The risks and benefits of                        the procedure and the sedation options and risks were                        discussed with the patient. All questions were                        answered, and informed consent was obtained. Prior                        Anticoagulants: The patient has taken no previous                        anticoagulant or antiplatelet agents. ASA Grade                        Assessment: II - A patient with mild systemic disease.                        After reviewing the risks and benefits, the patient was                        deemed in satisfactory condition to undergo the                        procedure.                       After obtaining informed consent, the colonoscope was                        passed under direct vision. Throughout the procedure,  the patient's blood pressure, pulse, and oxygen                        saturations were monitored continuously. The                        Colonoscope was introduced through the anus and                         advanced to the the cecum, identified by appendiceal                        orifice and ileocecal valve. The colonoscopy was                        performed without difficulty. The patient tolerated the                        procedure well. The quality of the bowel preparation                        was excellent. Findings:      The perianal and digital rectal examinations were normal.      A 4 mm polyp was found in the sigmoid colon. The polyp was sessile. The       polyp was removed with a cold biopsy forceps. Resection and retrieval       were complete.      Non-bleeding internal hemorrhoids were found during retroflexion. The       hemorrhoids were Grade I (internal hemorrhoids that do not prolapse). Impression:           - One 4 mm polyp in the sigmoid colon, removed with a                        cold biopsy forceps. Resected and retrieved.                       - Non-bleeding internal hemorrhoids. Recommendation:       - Discharge patient to home.                       - Resume previous diet.                       - Continue present medications.                       - Await pathology results.                       - Repeat colonoscopy in 5 years if polyp adenoma and 10                        years if hyperplastic Procedure Code(s):    --- Professional ---                       [phone], Colonoscopy, flexible; with biopsy, single or                        multiple Diagnosis Code(s):    --- Professional ---  Z12.11, Encounter for screening for malignant neoplasm                        of colon                       D12.5, Benign neoplasm of sigmoid colon CPT copyright 2016 [employer]. All rights reserved. The codes documented in this report are preliminary and upon coder review may  be revised to meet current compliance requirements. Lucilla Lame MD, MD 10/29/2016 10:31:56 AM This report has been signed electronically. Number of Addenda:  0 Note Initiated On: 10/29/2016 10:03 AM Scope Withdrawal Time: 0 hours 7 minutes 1 second  Total Procedure Duration: 0 hours 12 minutes 12 seconds       [facility]

## 2016-10-29 NOTE — Anesthesia Preprocedure Evaluation (Addendum)
Anesthesia Evaluation  Patient identified by MRN, date of birth, ID band Patient awake    Reviewed: Allergy & Precautions, NPO status , Patient's Chart, lab work & pertinent test results, reviewed documented beta blocker date and time   History of Anesthesia Complications (+) PONV and history of anesthetic complications  Airway Mallampati: III  TM Distance: >3 FB     Dental  (+) Upper Dentures, Lower Dentures   Pulmonary Current Smoker,           Cardiovascular hypertension, Pt. on medications and Pt. on home beta blockers      Neuro/Psych    GI/Hepatic GERD  ,  Endo/Other    Renal/GU      Musculoskeletal  (+) Arthritis ,   Abdominal   Peds  Hematology   Anesthesia Other Findings Obese. EKG shows old LBBB.  Reproductive/Obstetrics                            Anesthesia Physical Anesthesia Plan  ASA: III  Anesthesia Plan: General   Post-op Pain Management:    Induction: Intravenous  Airway Management Planned:   Additional Equipment:   Intra-op Plan:   Post-operative Plan:   Informed Consent: I have reviewed the patients History and Physical, chart, labs and discussed the procedure including the risks, benefits and alternatives for the proposed anesthesia with the patient or authorized representative who has indicated his/her understanding and acceptance.     Plan Discussed with: CRNA  Anesthesia Plan Comments:         Anesthesia Quick Evaluation

## 2016-10-29 NOTE — Transfer of Care (Signed)
Immediate Anesthesia Transfer of Care Note  Patient: Katherine Hartman  Procedure(s) Performed: Procedure(s): COLONOSCOPY WITH PROPOFOL (N/A) ESOPHAGOGASTRODUODENOSCOPY (EGD) WITH PROPOFOL (N/A)  Patient Location: PACU  Anesthesia Type:General  Level of Consciousness: awake, alert  and oriented  Airway & Oxygen Therapy: Patient Spontanous Breathing  Post-op Assessment: Report given to RN and Post -op Vital signs reviewed and stable  Post vital signs: Reviewed and stable  Last Vitals:  Vitals:   10/29/16 1030 10/29/16 1040  BP:  (!) 113/57  Pulse:  77  Resp:  17  Temp: (P) 36.3 C 36.3 C    Last Pain:  Vitals:   10/29/16 0904  TempSrc: Tympanic         Complications: No apparent anesthesia complications

## 2016-10-29 NOTE — Anesthesia Postprocedure Evaluation (Signed)
Anesthesia Post Note  Patient: Katherine Hartman  Procedure(s) Performed: Procedure(s) (LRB): COLONOSCOPY WITH PROPOFOL (N/A) ESOPHAGOGASTRODUODENOSCOPY (EGD) WITH PROPOFOL (N/A)  Patient location during evaluation: Endoscopy Anesthesia Type: General Level of consciousness: awake and alert Pain management: pain level controlled Vital Signs Assessment: post-procedure vital signs reviewed and stable Respiratory status: spontaneous breathing, nonlabored ventilation, respiratory function stable and patient connected to nasal cannula oxygen Cardiovascular status: blood pressure returned to baseline and stable Postop Assessment: no signs of nausea or vomiting Anesthetic complications: no     Last Vitals:  Vitals:   10/29/16 1050 10/29/16 1100  BP: 114/69 130/74  Pulse: 78 80  Resp: 19 (!) 22  Temp:      Last Pain:  Vitals:   10/29/16 1100  TempSrc:   PainSc: 0-No pain                 Jacey Pelc S

## 2016-10-29 NOTE — Anesthesia Post-op Follow-up Note (Cosign Needed)
Anesthesia QCDR form completed.        

## 2016-10-29 NOTE — Op Note (Signed)
[facility] Patient Name: Katherine Hartman Procedure Date: 10/29/2016 10:04 AM MRN: [id number] Account #: [id number] Date of Birth: Jun 21, 1951 Admit Type: Outpatient Age: 66 Room: [facility] ENDO ROOM 4 Gender: Female Note Status: Finalized Procedure:            Upper GI endoscopy Indications:          Epigastric abdominal pain Providers:            Lucilla Lame MD, MD Referring MD:         No Local Md, MD (Referring MD) Medicines:            Propofol per Anesthesia Complications:        No immediate complications. Procedure:            Pre-Anesthesia Assessment:                       - Prior to the procedure, a History and Physical was                        performed, and patient medications and allergies were                        reviewed. The patient's tolerance of previous                        anesthesia was also reviewed. The risks and benefits of                        the procedure and the sedation options and risks were                        discussed with the patient. All questions were                        answered, and informed consent was obtained. Prior                        Anticoagulants: The patient has taken no previous                        anticoagulant or antiplatelet agents. ASA Grade                        Assessment: II - A patient with mild systemic disease.                        After reviewing the risks and benefits, the patient was                        deemed in satisfactory condition to undergo the                        procedure.                       After obtaining informed consent, the endoscope was                        passed under direct vision. Throughout the procedure,  the patient's blood pressure, pulse, and oxygen                        saturations were monitored continuously. The Endoscope                        was introduced through the mouth, and advanced to the         second part of duodenum. The upper GI endoscopy was                        accomplished without difficulty. The patient tolerated                        the procedure well. Findings:      A small hiatal hernia was present.      One non-bleeding superficial gastric ulcer with no stigmata of bleeding       was found at the pylorus.      Localized moderate inflammation characterized by erosions was found in       the gastric antrum. Biopsies were taken with a cold forceps for       histology. Biopsies were taken with a cold forceps for histology.      The examined duodenum was normal. Impression:           - Small hiatal hernia.                       - Non-bleeding gastric ulcer with no stigmata of                        bleeding.                       - Gastritis. Biopsied.                       - Normal examined duodenum. Recommendation:       - Discharge patient to home.                       - Continue present medications. Procedure Code(s):    --- Professional ---                       [phone], Esophagogastroduodenoscopy, flexible, transoral;                        with biopsy, single or multiple Diagnosis Code(s):    --- Professional ---                       R10.13, Epigastric pain                       K25.9, Gastric ulcer, unspecified as acute or chronic,                        without hemorrhage or perforation                       K29.70, Gastritis, unspecified, without bleeding CPT copyright 2016 American Medical Association. All rights reserved. The codes documented in this report are preliminary and upon coder review may  be revised to meet current  compliance requirements. Lucilla Lame MD, MD 10/29/2016 10:15:32 AM This report has been signed electronically. Number of Addenda: 0 Note Initiated On: 10/29/2016 10:04 AM      [facility]

## 2016-10-29 NOTE — H&P (Signed)
Lucilla Lame, MD [facility] [address] [place], [place] [address] Phone:[phone] Fax : [phone]  Primary Care Physician:  [place] Primary Gastroenterologist:  Dr. Allen Norris  Pre-Procedure History & Physical: HPI:  Katherine Hartman is a 66 y.o. female is here for an endoscopy and colonoscopy.   Past Medical History:  Diagnosis Date  . Arthritis   . GERD (gastroesophageal reflux disease)   . Hypertension   . 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 ABDOMINAL HYSTERECTOMY    . BLADDER SUSPENSION N/A   . JOINT REPLACEMENT Right 2017   knee  . 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 TOTAL KNEE ARTHROPLASTY Left 03/05/2016   Procedure: TOTAL KNEE ARTHROPLASTY;  Surgeon: Hessie Knows, MD;  Location: [facility] ORS;  Service: Orthopedics;  Laterality: Left;  . TUBAL LIGATION      Prior to Admission medications   Medication Sig Start Date End Date Taking? Authorizing Provider  AZO-CRANBERRY PO Take 2 tablets by mouth daily.   Yes Historical Provider, MD  bisoprolol-hydrochlorothiazide (ZIAC) 5-6.25 MG tablet Take 1 tablet by mouth daily. 11/23/15  Yes Historical Provider, MD  cyclobenzaprine (FLEXERIL) 10 MG tablet Take 1 tablet (10 mg total) by mouth 3 (three) times daily as needed for muscle spasms. 06/27/16  Yes Jami L Hagler, PA-C  dicyclomine (BENTYL) 20 MG tablet Take 1 tablet (20 mg total) by mouth 3 (three) times daily as needed for spasms. 10/04/16 10/04/17 Yes Darel Hong, MD  diphenhydrAMINE (BENADRYL) 25 MG tablet Take 25 mg by mouth daily as needed for allergies.   Yes Historical Provider, MD  famotidine (PEPCID) 40 MG tablet Take 1 tablet (40 mg total) by mouth every evening. 10/04/16 10/04/17 Yes Darel Hong, MD    meloxicam (MOBIC) 7.5 MG tablet Take 7.5 mg by mouth 2 (two) times daily. 09/15/16  Yes Historical Provider, MD  Multiple Vitamin (MULTIVITAMIN WITH MINERALS) TABS tablet Take 1 tablet by mouth daily.   Yes Historical Provider, MD  omeprazole (PRILOSEC) 40 MG capsule Take 40 mg by mouth daily.   Yes Historical Provider, MD  ondansetron (ZOFRAN) 4 MG tablet Take 1 tablet (4 mg total) by mouth daily as needed for nausea or vomiting. 10/04/16 10/04/17 Yes Darel Hong, MD  oxyCODONE (OXY IR/ROXICODONE) 5 MG immediate release tablet Take 1 tablet (5 mg total) by mouth every 4 (four) hours as needed for severe pain. 06/27/16  Yes Jami L Hagler, PA-C  promethazine (PHENERGAN) 25 MG tablet Take 25 mg by mouth every 6 (six) hours as needed for nausea or vomiting.   Yes Historical Provider, MD  zolpidem (AMBIEN) 5 MG tablet Take 5 mg by mouth at bedtime as needed. 09/15/16  Yes Historical Provider, MD  enoxaparin (LOVENOX) 40 MG/0.4ML injection Inject 0.4 mLs (40 mg total) into the skin daily. Patient not taking: Reported on 10/04/2016 03/07/16 03/22/16  Duanne Guess, PA-C    Allergies as of 10/09/2016 - Review Complete 10/04/2016  Allergen Reaction Noted  . Ibuprofen Other (See Comments) 08/08/2015  . Tylenol [acetaminophen] Other (See Comments) 06/27/2016    Family History  Problem Relation Age of Onset  . Cancer Mother   . Hypertension Mother   . Diabetes Mother   . Breast cancer Paternal Aunt   .  Breast cancer Paternal Aunt     Social History   Social History  . Marital status: Single    Spouse name: N/A  . Number of children: N/A  . Years of education: N/A   Occupational History  . Not on file.   Social History Main Topics  . Smoking status: Current Every Day Smoker    Packs/day: 0.25    Years: 50.00    Types: Cigarettes  . Smokeless tobacco: Never Used  . Alcohol use No     Comment: stopped in Sept. 2016  . Drug use: Yes    Types: Marijuana     Comment: occasional marijuana   . Sexual activity: Yes   Other Topics Concern  . Not on file   Social History Narrative  . No narrative on file    Review of Systems: See HPI, otherwise negative ROS  Physical Exam: BP (!) 147/98   Pulse 79   Temp 97.9 F (36.6 C) (Tympanic)   Resp 20   Ht 5' 8.5" (1.74 m)   Wt 230 lb (104.3 kg)   SpO2 97%   BMI 34.46 kg/m  General:   Alert,  pleasant and cooperative in NAD Head:  Normocephalic and atraumatic. Neck:  Supple; no masses or thyromegaly. Lungs:  Clear throughout to auscultation.    Heart:  Regular rate and rhythm. Abdomen:  Soft, nontender and nondistended. Normal bowel sounds, without guarding, and without rebound.   Neurologic:  Alert and  oriented x4;  grossly normal neurologically.  Impression/Plan: Katherine Hartman is here for an endoscopy and colonoscopy to be performed for Epigastric pain and screening  Risks, benefits, limitations, and alternatives regarding  endoscopy and colonoscopy have been reviewed with the patient.  Questions have been answered.  All parties agreeable.   Lucilla Lame, MD  10/29/2016, 10:36 AM

## 2016-10-30 ENCOUNTER — Encounter: Payer: Self-pay | Admitting: Gastroenterology

## 2016-10-30 LAB — SURGICAL PATHOLOGY

## 2016-10-30 NOTE — OR Nursing (Signed)
Pt PASSED   A SMALL TABLESPOON SIZE BLOODY CLOT YESTERDAY. NO FURTHER PASSAGE OF STOOL/ BLOOD.Marland Kitchen PT INSTRUCTED TO CALL MD'S OFFICE TO TELL THEM AND IF ANY BLANTANT BLEEDING CALL 911.

## 2016-11-01 ENCOUNTER — Telehealth: Payer: Self-pay

## 2016-11-01 NOTE — Telephone Encounter (Signed)
Pt scheduled for a follow up appt to discuss colonoscopy results.  

## 2016-11-01 NOTE — Telephone Encounter (Signed)
-----   Message from Lucilla Lame, MD sent at 10/31/2016  1:51 PM EDT ----- Please have the patient come in for a follow up.

## 2016-11-12 DIAGNOSIS — I10 Essential (primary) hypertension: Secondary | ICD-10-CM | POA: Diagnosis not present

## 2016-11-12 DIAGNOSIS — F17210 Nicotine dependence, cigarettes, uncomplicated: Secondary | ICD-10-CM | POA: Diagnosis not present

## 2016-11-12 DIAGNOSIS — J440 Chronic obstructive pulmonary disease with acute lower respiratory infection: Secondary | ICD-10-CM | POA: Diagnosis not present

## 2016-11-12 DIAGNOSIS — R112 Nausea with vomiting, unspecified: Secondary | ICD-10-CM | POA: Diagnosis not present

## 2016-11-13 ENCOUNTER — Encounter: Payer: Self-pay | Admitting: Gastroenterology

## 2016-11-13 ENCOUNTER — Ambulatory Visit (INDEPENDENT_AMBULATORY_CARE_PROVIDER_SITE_OTHER): Payer: Medicare Other | Admitting: Gastroenterology

## 2016-11-13 VITALS — BP 189/98 | HR 72 | Temp 98.2°F | Resp 18 | Ht 70.0 in | Wt 235.0 lb

## 2016-11-13 DIAGNOSIS — R1013 Epigastric pain: Secondary | ICD-10-CM | POA: Diagnosis not present

## 2016-11-13 DIAGNOSIS — K2960 Other gastritis without bleeding: Secondary | ICD-10-CM

## 2016-11-13 DIAGNOSIS — G8929 Other chronic pain: Secondary | ICD-10-CM

## 2016-11-13 MED ORDER — PANTOPRAZOLE SODIUM 40 MG PO TBEC: 40.0000 mg | DELAYED_RELEASE_TABLET | Freq: Two times a day (BID) | ORAL | 6 refills | Status: AC

## 2016-11-14 ENCOUNTER — Other Ambulatory Visit: Payer: Self-pay | Admitting: Internal Medicine

## 2016-11-14 ENCOUNTER — Ambulatory Visit
Admission: RE | Admit: 2016-11-14 | Discharge: 2016-11-14 | Disposition: A | Discharge status: Home / Self Care | Payer: Medicare Other | Source: Ambulatory Visit | Attending: Internal Medicine | Admitting: Internal Medicine

## 2016-11-14 DIAGNOSIS — R059 Cough, unspecified: Secondary | ICD-10-CM

## 2016-11-14 DIAGNOSIS — R05 Cough: Secondary | ICD-10-CM

## 2016-11-14 DIAGNOSIS — J9811 Atelectasis: Secondary | ICD-10-CM | POA: Diagnosis not present

## 2016-11-14 DIAGNOSIS — R0602 Shortness of breath: Secondary | ICD-10-CM | POA: Diagnosis not present

## 2016-11-14 NOTE — Progress Notes (Signed)
Primary Care Physician: [facility]  Primary Gastroenterologist:  Dr. Lucilla Lame  Chief Complaint  Patient presents with  . Follow-up  . Abdominal Pain    sharp at times    HPI: Katherine Hartman is a 66 y.o. female here for follow-up after having an upper endoscopy for epigastric pain and left-sided pain that radiates to her back. 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  There was a report of indefinite findings for low-grade dysplasia.  The biopsies were also negative for H. Pylori.  The patient reports that her abdominal pain is mostly in the epigastric area that wraps around her left side into her back.  There is no report of any unexplained weight loss or nausea and vomiting.  Current Outpatient Prescriptions  Medication Sig Dispense Refill  . azithromycin (ZITHROMAX) 250 MG tablet     . AZO-CRANBERRY PO Take 2 tablets by mouth daily.    . bisoprolol-hydrochlorothiazide (ZIAC) 5-6.25 MG tablet Take 1 tablet by mouth daily.    . cyclobenzaprine (FLEXERIL) 10 MG tablet Take 1 tablet (10 mg total) by mouth 3 (three) times daily as needed for muscle spasms. (Patient not taking: Reported on 11/13/2016) 21 tablet 0  . cyclobenzaprine (FLEXERIL) 5 MG tablet     . dicyclomine (BENTYL) 20 MG tablet Take 1 tablet (20 mg total) by mouth 3 (three) times daily as needed for spasms. (Patient not taking: Reported on 11/13/2016) 30 tablet 0  . diphenhydrAMINE (BENADRYL) 25 MG tablet Take 25 mg by mouth daily as needed for allergies.    Marland Kitchen enoxaparin (LOVENOX) 40 MG/0.4ML injection Inject 0.4 mLs (40 mg total) into the skin daily. (Patient not taking: Reported on 10/04/2016) 14 Syringe 0  . famotidine (PEPCID) 40 MG tablet Take 1 tablet (40 mg total) by mouth every evening. (Patient not taking: Reported on 11/13/2016) 30 tablet 1  . meloxicam (MOBIC) 7.5 MG tablet Take 7.5 mg by mouth 2 (two) times daily.    . Multiple Vitamin  (MULTIVITAMIN WITH MINERALS) TABS tablet Take 1 tablet by mouth daily.    . ondansetron (ZOFRAN) 4 MG tablet Take 1 tablet (4 mg total) by mouth daily as needed for nausea or vomiting. (Patient not taking: Reported on 11/13/2016) 30 tablet 1  . oxyCODONE (OXY IR/ROXICODONE) 5 MG immediate release tablet Take 1 tablet (5 mg total) by mouth every 4 (four) hours as needed for severe pain. (Patient not taking: Reported on 11/13/2016) 6 tablet 0  . oxycodone (OXY-IR) 5 MG capsule Take by mouth.    . pantoprazole (PROTONIX) 40 MG tablet Take 1 tablet (40 mg total) by mouth 2 (two) times daily. 60 tablet 6  . promethazine (PHENERGAN) 25 MG tablet Take 25 mg by mouth every 6 (six) hours as needed for nausea or vomiting.    Marland Kitchen zolpidem (AMBIEN) 10 MG tablet     . zolpidem (AMBIEN) 5 MG tablet Take 5 mg by mouth at bedtime as needed.     No current facility-administered medications for this visit.     Allergies as of 11/13/2016 - Review Complete 11/13/2016  Allergen Reaction Noted  . Ibuprofen Other (See Comments) 08/08/2015  . Tylenol [acetaminophen] Other (See Comments) 06/27/2016    ROS:  General: Negative for anorexia, weight loss, fever, chills, fatigue, weakness. ENT: Negative for hoarseness, difficulty swallowing , nasal congestion. CV: Negative for chest pain, angina, palpitations, dyspnea on exertion, peripheral edema.  Respiratory: Negative for dyspnea at  rest, dyspnea on exertion, cough, sputum, wheezing.  GI: See history of present illness. GU:  Negative for dysuria, hematuria, urinary incontinence, urinary frequency, nocturnal urination.  Endo: Negative for unusual weight change.    Physical Examination:   BP (!) 189/98   Pulse 72   Temp 98.2 F (36.8 C)   Resp 18   Ht 5\' 10"  (1.778 m)   Wt 235 lb (106.6 kg)   BMI 33.72 kg/m   General: Well-nourished, well-developed in no acute distress.  Eyes: No icterus. Conjunctivae pink. Mouth: Oropharyngeal mucosa moist and pink , no  lesions erythema or exudate. Lungs: Clear to auscultation bilaterally. Non-labored. Heart: Regular rate and rhythm, no murmurs rubs or gallops.  Abdomen: Bowel sounds are normal, nontender, nondistended, no hepatosplenomegaly or masses, no abdominal bruits or hernia , no rebound or guarding.   Extremities: No lower extremity edema. No clubbing or deformities. Neuro: Alert and oriented x 3.  Grossly intact. Skin: Warm and dry, no jaundice.   Psych: Alert and cooperative, normal mood and affect.  Labs:    Imaging Studies: No results found.  Assessment and Plan:   Katherine Hartman is a 66 y.o. y/o female who has epigastric pain and was found to have erosive gastritis.  The patient is on omeprazole presently and will be switched to Protonix twice a day to see if this helps heal up her erosive gastritis and also to alleviate her pain. The patient was explained that if her pain is caused by the erosions and her gastritis this medication should help.  Despite that the patient kept asking what I'm going to do for her pain.  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  The patient has been explained the plan and agrees with it.    Lucilla Lame, MD. Marval Regal   Note: This dictation was prepared with Dragon dictation along with smaller phrase technology. Any transcriptional errors that result from this process are unintentional.

## 2016-11-20 DIAGNOSIS — R0602 Shortness of breath: Secondary | ICD-10-CM | POA: Diagnosis not present

## 2016-12-24 DIAGNOSIS — I10 Essential (primary) hypertension: Secondary | ICD-10-CM | POA: Diagnosis not present

## 2016-12-24 DIAGNOSIS — R918 Other nonspecific abnormal finding of lung field: Secondary | ICD-10-CM | POA: Diagnosis not present

## 2016-12-24 DIAGNOSIS — M79645 Pain in left finger(s): Secondary | ICD-10-CM | POA: Diagnosis not present

## 2016-12-24 DIAGNOSIS — M549 Dorsalgia, unspecified: Secondary | ICD-10-CM | POA: Diagnosis not present

## 2017-02-24 DIAGNOSIS — F17210 Nicotine dependence, cigarettes, uncomplicated: Secondary | ICD-10-CM | POA: Diagnosis not present

## 2017-02-24 DIAGNOSIS — R0602 Shortness of breath: Secondary | ICD-10-CM | POA: Diagnosis not present

## 2017-02-24 DIAGNOSIS — R918 Other nonspecific abnormal finding of lung field: Secondary | ICD-10-CM | POA: Diagnosis not present

## 2017-02-24 DIAGNOSIS — G4700 Insomnia, unspecified: Secondary | ICD-10-CM | POA: Diagnosis not present

## 2017-03-07 ENCOUNTER — Other Ambulatory Visit: Payer: Self-pay | Admitting: Internal Medicine

## 2017-03-07 DIAGNOSIS — R918 Other nonspecific abnormal finding of lung field: Secondary | ICD-10-CM

## 2017-03-13 ENCOUNTER — Ambulatory Visit
Admission: RE | Admit: 2017-03-13 | Discharge: 2017-03-13 | Disposition: A | Discharge status: Home / Self Care | Payer: Medicare Other | Source: Ambulatory Visit | Attending: Internal Medicine | Admitting: Internal Medicine

## 2017-03-13 DIAGNOSIS — I700 Atherosclerosis of aorta: Secondary | ICD-10-CM | POA: Insufficient documentation

## 2017-03-13 DIAGNOSIS — R918 Other nonspecific abnormal finding of lung field: Secondary | ICD-10-CM | POA: Insufficient documentation

## 2017-03-13 LAB — POCT I-STAT CREATININE: CREATININE: 1.3 mg/dL — AB (ref 0.44–1.00)

## 2017-03-13 MED ORDER — IOPAMIDOL (ISOVUE-300) INJECTION 61%
75.0000 mL | Freq: Once | INTRAVENOUS | Status: AC | PRN
  Administered 2017-03-13: 75 mL via INTRAVENOUS

## 2017-04-07 ENCOUNTER — Emergency Department
Admission: EM | Admit: 2017-04-07 | Discharge: 2017-04-07 | Disposition: A | Discharge status: Home / Self Care | Payer: Medicare Other | Attending: Emergency Medicine | Admitting: Emergency Medicine

## 2017-04-07 ENCOUNTER — Emergency Department: Payer: Medicare Other

## 2017-04-07 DIAGNOSIS — Z96653 Presence of artificial knee joint, bilateral: Secondary | ICD-10-CM | POA: Diagnosis not present

## 2017-04-07 DIAGNOSIS — Z79899 Other long term (current) drug therapy: Secondary | ICD-10-CM | POA: Insufficient documentation

## 2017-04-07 DIAGNOSIS — F17210 Nicotine dependence, cigarettes, uncomplicated: Secondary | ICD-10-CM | POA: Insufficient documentation

## 2017-04-07 DIAGNOSIS — R519 Headache, unspecified: Secondary | ICD-10-CM

## 2017-04-07 DIAGNOSIS — I10 Essential (primary) hypertension: Secondary | ICD-10-CM

## 2017-04-07 DIAGNOSIS — R51 Headache: Secondary | ICD-10-CM | POA: Insufficient documentation

## 2017-04-07 LAB — CBC
HCT: 37.2 % (ref 35.0–47.0)
HEMOGLOBIN: 12.4 g/dL (ref 12.0–16.0)
MCH: 26.9 pg (ref 26.0–34.0)
MCHC: 33.4 g/dL (ref 32.0–36.0)
MCV: 80.4 fL (ref 80.0–100.0)
Platelets: 324 10*3/uL (ref 150–440)
RBC: 4.63 MIL/uL (ref 3.80–5.20)
RDW: 16.5 % — ABNORMAL HIGH (ref 11.5–14.5)
WBC: 10 10*3/uL (ref 3.6–11.0)

## 2017-04-07 LAB — BASIC METABOLIC PANEL
Anion gap: 8 (ref 5–15)
BUN: 19 mg/dL (ref 6–20)
CO2: 28 mmol/L (ref 22–32)
Calcium: 9.7 mg/dL (ref 8.9–10.3)
Chloride: 103 mmol/L (ref 101–111)
Creatinine, Ser: 1.02 mg/dL — ABNORMAL HIGH (ref 0.44–1.00)
GFR, EST NON AFRICAN AMERICAN: 56 mL/min — AB (ref >60)
Glucose, Bld: 88 mg/dL (ref 65–99)
POTASSIUM: 4.1 mmol/L (ref 3.5–5.1)
Sodium: 139 mmol/L (ref 135–145)

## 2017-04-07 MED ORDER — PROMETHAZINE HCL 25 MG PO TABS: 25.0000 mg | ORAL_TABLET | Freq: Four times a day (QID) | ORAL | 0 refills | Status: AC | PRN

## 2017-04-07 MED ORDER — PROMETHAZINE HCL 25 MG PO TABS
50.0000 mg | ORAL_TABLET | Freq: Once | ORAL | Status: AC
  Administered 2017-04-07: 50 mg via ORAL
  Filled 2017-04-07: qty 2

## 2017-04-07 MED ORDER — BUTALBITAL-APAP-CAFFEINE 50-325-40 MG PO TABS
2.0000 | ORAL_TABLET | Freq: Once | ORAL | Status: AC
  Administered 2017-04-07: 2 via ORAL
  Filled 2017-04-07: qty 2

## 2017-04-07 MED ORDER — ZOLPIDEM TARTRATE ER 6.25 MG PO TBCR: 6.2500 mg | EXTENDED_RELEASE_TABLET | Freq: Every evening | ORAL | 0 refills | Status: AC | PRN

## 2017-04-07 NOTE — Discharge Instructions (Signed)
Fortunately today your blood work and your CT scan were reassuring. Please make an appointment to follow-up with your primary care physician in one week for a blood pressure recheck and return to the emergency department for any concerns.  It was a pleasure to take care of you today, and thank you for coming to our emergency department.  If you have any questions or concerns before leaving please ask the nurse to grab me and I'm more than happy to go through your aftercare instructions again.  If you were prescribed any opioid pain medication today such as Norco, Vicodin, Percocet, morphine, hydrocodone, or oxycodone please make sure you do not drive when you are taking this medication as it can alter your ability to drive safely.  If you have any concerns once you are home that you are not improving or are in fact getting worse before you can make it to your follow-up appointment, please do not hesitate to call 911 and come back for further evaluation.  Darel Hong, MD  Results for orders placed or performed during the hospital encounter of 04/07/17  CBC  Result Value Ref Range   WBC 10.0 3.6 - 11.0 K/uL   RBC 4.63 3.80 - 5.20 MIL/uL   Hemoglobin 12.4 12.0 - 16.0 g/dL   HCT 37.2 35.0 - 47.0 %   MCV 80.4 80.0 - 100.0 fL   MCH 26.9 26.0 - 34.0 pg   MCHC 33.4 32.0 - 36.0 g/dL   RDW 16.5 (H) 11.5 - 14.5 %   Platelets 324 150 - 440 K/uL  Basic metabolic panel  Result Value Ref Range   Sodium 139 135 - 145 mmol/L   Potassium 4.1 3.5 - 5.1 mmol/L   Chloride 103 101 - 111 mmol/L   CO2 28 22 - 32 mmol/L   Glucose, Bld 88 65 - 99 mg/dL   BUN 19 6 - 20 mg/dL   Creatinine, Ser 1.02 (H) 0.44 - 1.00 mg/dL   Calcium 9.7 8.9 - 10.3 mg/dL   GFR calc non Af Amer 56 (L) >60 mL/min   GFR calc Af Amer >60 >60 mL/min   Anion gap 8 5 - 15   Ct Head Wo Contrast  Result Date: 04/07/2017 CLINICAL DATA:  Headache.  Elevated blood pressure. EXAM: CT HEAD WITHOUT CONTRAST TECHNIQUE: Contiguous axial  images were obtained from the base of the skull through the vertex without intravenous contrast. COMPARISON:  04/14/2014 FINDINGS: Brain: The ventricles are normal in size and configuration. No extra-axial fluid collections are identified. The gray-white differentiation is normal. No CT findings for acute intracranial process such as hemorrhage or infarction. No mass lesions. The brainstem and cerebellum are grossly normal. Vascular: No hyperdense vessels or worrisome calcifications. Scattered vascular calcifications. Skull: No skull lesions or fracture. Sinuses/Orbits: The paranasal sinuses mastoid air cells are clear. The globes are intact. Other: No scalp lesions or hematoma. IMPRESSION: No acute intracranial findings. Electronically Signed   By: Marijo Sanes M.D.   On: 04/07/2017 18:16   Ct Chest W Contrast  Result Date: 03/13/2017 CLINICAL DATA:  Abnormal chest x-ray. EXAM: CT CHEST WITH CONTRAST TECHNIQUE: Multidetector CT imaging of the chest was performed during intravenous contrast administration. CONTRAST:  56mL ISOVUE-300 IOPAMIDOL (ISOVUE-300) INJECTION 61% COMPARISON:  Radiographs of November 14, 2016. CT scan of October 04, 2016. FINDINGS: Cardiovascular: Atherosclerosis of thoracic aorta is noted without aneurysm or dissection. Normal cardiac size. No pericardial effusion is noted. Mediastinum/Nodes: No enlarged mediastinal, hilar, or axillary lymph nodes. Thyroid  gland, trachea, and esophagus demonstrate no significant findings. Lungs/Pleura: Lungs are clear. No pleural effusion or pneumothorax. Upper Abdomen: No acute abnormality. Musculoskeletal: No chest wall abnormality. No acute or significant osseous findings. IMPRESSION: No acute abnormality seen in the chest. Aortic Atherosclerosis (ICD10-I70.0). Electronically Signed   By: Marijo Conception, M.D.   On: 03/13/2017 16:09

## 2017-04-07 NOTE — ED Triage Notes (Signed)
Pt was at PCP for a follow up from a lung CT and her b/p was elevated .  Pt states they just recently upped her dose to BID.Marland Kitchen Pt c/o having a HA.Marland Kitchen

## 2017-04-07 NOTE — ED Provider Notes (Signed)
[facility] Emergency Department Provider Note  ____________________________________________   First MD Initiated Contact with Patient 04/07/17 1738     (approximate)  I have reviewed the triage vital signs and the nursing notes.   HISTORY  Chief Complaint Hypertension    HPI Katherine Hartman is a 66 y.o. female who sent to the emergency department from outpatient CT for elevated blood pressure. She went for routine surveillance CT scan of her chest when they noted her blood pressure was 200/100. They rechecked it and it was slightly higher advised her to come to the emergency department. She does note a gradual onset not maximal onset right sided throbbing headache that is similar to previous headache she has had that began today. It is associated with nausea but no vomiting. She does not get frequent headaches. She has no double vision or blurred vision. Nothing seems to make the pain or nausea better or worse.   Past Medical History:  Diagnosis Date  . Arthritis   . GERD (gastroesophageal reflux disease)   . Hypertension   . 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 Colon cancer screening   . Polyp of sigmoid colon   . Abdominal pain, epigastric   . Peptic ulcer of stomach   . Gastritis without bleeding   . Osteoarthrosis 03/05/2016  . Primary osteoarthritis of right knee 08/24/2015  . Backache 12/23/2003  . Hypertension, benign 12/08/2003    Past Surgical History:  Procedure Laterality Date  . ABDOMINAL HYSTERECTOMY    . BLADDER SUSPENSION N/A   . COLONOSCOPY WITH PROPOFOL N/A 10/29/2016   Procedure: COLONOSCOPY WITH PROPOFOL;  Surgeon: Lucilla Lame, MD;  Location: [facility] ENDOSCOPY;  Service: Endoscopy;  Laterality: N/A;  . ESOPHAGOGASTRODUODENOSCOPY (EGD) WITH PROPOFOL N/A 10/29/2016   Procedure: ESOPHAGOGASTRODUODENOSCOPY (EGD) WITH  PROPOFOL;  Surgeon: Lucilla Lame, MD;  Location: [facility] ENDOSCOPY;  Service: Endoscopy;  Laterality: N/A;  . JOINT REPLACEMENT Right 2017   knee  . 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 TOTAL KNEE ARTHROPLASTY Left 03/05/2016   Procedure: TOTAL KNEE ARTHROPLASTY;  Surgeon: Hessie Knows, MD;  Location: [facility] ORS;  Service: Orthopedics;  Laterality: Left;  . TUBAL LIGATION      Prior to Admission medications   Medication Sig Start Date End Date Taking? Authorizing Provider  azithromycin (ZITHROMAX) 250 MG tablet  11/12/16   [provider]  AZO-CRANBERRY PO Take 2 tablets by mouth daily.    [provider]  bisoprolol-hydrochlorothiazide (ZIAC) 5-6.25 MG tablet Take 1 tablet by mouth daily. 11/23/15   [provider]  cyclobenzaprine (FLEXERIL) 10 MG tablet Take 1 tablet (10 mg total) by mouth 3 (three) times daily as needed for muscle spasms. Patient not taking: Reported on 11/13/2016 06/27/16   Hagler, Wende Crease L, PA-C  cyclobenzaprine (FLEXERIL) 5 MG tablet  10/28/16   [provider]  dicyclomine (BENTYL) 20 MG tablet Take 1 tablet (20 mg total) by mouth 3 (three) times daily as needed for spasms. Patient not taking: Reported on 11/13/2016 10/04/16 10/04/17  Darel Hong, MD  diphenhydrAMINE (BENADRYL) 25 MG tablet Take 25 mg by mouth daily as needed for allergies.    [provider]  enoxaparin (LOVENOX) 40 MG/0.4ML injection Inject 0.4 mLs (40 mg  total) into the skin daily. Patient not taking: Reported on 10/04/2016 03/07/16 03/22/16  Duanne Guess, PA-C  famotidine (PEPCID) 40 MG tablet Take 1 tablet (40 mg total) by mouth every evening. Patient not taking: Reported on 11/13/2016 10/04/16 10/04/17  Darel Hong, MD  meloxicam (MOBIC) 7.5 MG tablet Take 7.5 mg by mouth 2 (two) times daily. 09/15/16   [provider]    Multiple Vitamin (MULTIVITAMIN WITH MINERALS) TABS tablet Take 1 tablet by mouth daily.    [provider]  ondansetron (ZOFRAN) 4 MG tablet Take 1 tablet (4 mg total) by mouth daily as needed for nausea or vomiting. Patient not taking: Reported on 11/13/2016 10/04/16 10/04/17  Darel Hong, MD  oxyCODONE (OXY IR/ROXICODONE) 5 MG immediate release tablet Take 1 tablet (5 mg total) by mouth every 4 (four) hours as needed for severe pain. Patient not taking: Reported on 11/13/2016 06/27/16   Hagler, Jami L, PA-C  oxycodone (OXY-IR) 5 MG capsule Take by mouth. 06/05/16   [provider]  pantoprazole (PROTONIX) 40 MG tablet Take 1 tablet (40 mg total) by mouth 2 (two) times daily. 11/13/16   Lucilla Lame, MD  promethazine (PHENERGAN) 25 MG tablet Take 1 tablet (25 mg total) by mouth every 6 (six) hours as needed for nausea or vomiting. 04/07/17   Darel Hong, MD  zolpidem (AMBIEN CR) 6.25 MG CR tablet Take 1 tablet (6.25 mg total) by mouth at bedtime as needed for sleep. 04/07/17   Darel Hong, MD    Allergies Ibuprofen and Tylenol [acetaminophen]  Family History  Problem Relation Age of Onset  . Cancer Mother   . Hypertension Mother   . Diabetes Mother   . Breast cancer Paternal Aunt   . Breast cancer Paternal Aunt     Social History Social History  Substance Use Topics  . Smoking status: Current Every Day Smoker    Packs/day: 0.25    Years: 50.00    Types: Cigarettes  . Smokeless tobacco: Never Used  . Alcohol use No     Comment: stopped in Sept. 2016    Review of Systems Constitutional: No fever/chills Eyes: No visual changes. ENT: No sore throat. Cardiovascular: Denies chest pain. Respiratory: Denies shortness of breath. Gastrointestinal: No abdominal pain.  Positive nausea, no vomiting.  No diarrhea.  No constipation. Genitourinary: Negative for dysuria. Musculoskeletal: Negative for back pain. Skin: Negative for rash. Neurological: positive for  headache   ____________________________________________   PHYSICAL EXAM:  VITAL SIGNS: ED Triage Vitals  Enc Vitals Group     BP 04/07/17 1642 (!) 176/89     Pulse Rate 04/07/17 1642 71     Resp 04/07/17 1642 16     Temp 04/07/17 1642 98.7 F (37.1 C)     Temp Source 04/07/17 1642 Oral     SpO2 04/07/17 1642 99 %     Weight 04/07/17 1644 237 lb (107.5 kg)     Height 04/07/17 1644 5\' 8"  (1.727 m)     Head Circumference --      Peak Flow --      Pain Score 04/07/17 1643 10     Pain Loc --      Pain Edu? --      Excl. in [place]? --     Constitutional: alert and oriented 4 appears somewhat uncomfortable nontoxic no diaphoresis speaks in full clear sentences Eyes: PERRL EOMI.pupils are midrange and brisk Head: Atraumatic. Nose: No congestion/rhinnorhea. Mouth/Throat: No trismus Neck: No stridor.  Cardiovascular: Normal rate, regular rhythm. Grossly normal heart sounds.  Good peripheral circulation. Respiratory: Normal respiratory effort.  No retractions. Lungs CTAB and moving good air Gastrointestinal: soft nontender Musculoskeletal: No lower extremity edema   Neurologic:  Normal speech and language. No gross focal neurologic deficits are appreciated. Skin:  Skin is warm, dry and intact. No rash noted. Psychiatric: Mood and affect are normal. Speech and behavior are normal.    ____________________________________________   DIFFERENTIAL includes but not limited to  tension headache, migraine headache, dissection, intracerebral hemorrhage, stroke ____________________________________________   LABS (all labs ordered are listed, but only abnormal results are displayed)  Labs Reviewed  CBC - Abnormal; Notable for the following:       Result Value   RDW 16.5 (*)    All other components within normal limits  BASIC METABOLIC PANEL - Abnormal; Notable for the following:    Creatinine, Ser 1.02 (*)    GFR calc non Af Amer 56 (*)    All other components within normal  limits    Normal renal function __________________________________________  EKG   ____________________________________________  RADIOLOGY  Head CT with no acute disease ____________________________________________   PROCEDURES  Procedure(s) performed: no  Procedures  Critical Care performed: no  Observation: no ____________________________________________   INITIAL IMPRESSION / ASSESSMENT AND PLAN / ED COURSE  Pertinent labs & imaging results that were available during my care of the patient were reviewed by me and considered in my medical decision making (see chart for details).  The patient is uncomfortable appearing but with a normal neurological exam. Her blood pressure is not markedly elevated today. Her headache is low risk, however she does not have frequent headaches and we'll get a CT scan of her head while waiting for Fioricet to kick in. She has requested Phenergan instead of Zofran.     After symptomatic treatment the patient's headache is resolved and her blood pressures improved. I will refer her back to her primary care physician. ____________________________________________   FINAL CLINICAL IMPRESSION(S) / ED DIAGNOSES  Final diagnoses:  Nonintractable headache, unspecified chronicity pattern, unspecified headache type  Hypertension, unspecified type      NEW MEDICATIONS STARTED DURING THIS VISIT:  Discharge Medication List as of 04/07/2017  7:26 PM    START taking these medications   Details  zolpidem (AMBIEN CR) 6.25 MG CR tablet Take 1 tablet (6.25 mg total) by mouth at bedtime as needed for sleep., Starting Mon 04/07/2017, Print         Note:  This document was prepared using Dragon voice recognition software and may include unintentional dictation errors.     Darel Hong, MD 04/08/17 2204

## 2017-04-07 NOTE — ED Notes (Signed)
Patient transported to CT 

## 2017-05-05 DIAGNOSIS — M75102 Unspecified rotator cuff tear or rupture of left shoulder, not specified as traumatic: Secondary | ICD-10-CM | POA: Diagnosis not present

## 2017-05-05 DIAGNOSIS — M25512 Pain in left shoulder: Secondary | ICD-10-CM | POA: Diagnosis not present

## 2017-05-05 DIAGNOSIS — M7582 Other shoulder lesions, left shoulder: Secondary | ICD-10-CM | POA: Diagnosis not present

## 2017-05-06 ENCOUNTER — Other Ambulatory Visit: Payer: Self-pay | Admitting: Surgery

## 2017-05-06 DIAGNOSIS — M75102 Unspecified rotator cuff tear or rupture of left shoulder, not specified as traumatic: Secondary | ICD-10-CM

## 2017-05-06 DIAGNOSIS — M25512 Pain in left shoulder: Secondary | ICD-10-CM

## 2017-05-06 DIAGNOSIS — M7582 Other shoulder lesions, left shoulder: Secondary | ICD-10-CM

## 2017-05-12 ENCOUNTER — Ambulatory Visit: Payer: Medicare Other

## 2017-05-12 DIAGNOSIS — R51 Headache: Secondary | ICD-10-CM | POA: Diagnosis not present

## 2017-05-12 DIAGNOSIS — I10 Essential (primary) hypertension: Secondary | ICD-10-CM | POA: Diagnosis not present

## 2017-05-12 DIAGNOSIS — Z9119 Patient's noncompliance with other medical treatment and regimen: Secondary | ICD-10-CM | POA: Diagnosis not present

## 2017-05-12 DIAGNOSIS — R112 Nausea with vomiting, unspecified: Secondary | ICD-10-CM | POA: Diagnosis not present

## 2017-05-19 ENCOUNTER — Ambulatory Visit
Admission: RE | Admit: 2017-05-19 | Discharge: 2017-05-19 | Disposition: A | Discharge status: Home / Self Care | Payer: Medicare Other | Source: Ambulatory Visit | Attending: Surgery | Admitting: Surgery

## 2017-05-19 DIAGNOSIS — M7582 Other shoulder lesions, left shoulder: Secondary | ICD-10-CM

## 2017-05-19 DIAGNOSIS — M75102 Unspecified rotator cuff tear or rupture of left shoulder, not specified as traumatic: Secondary | ICD-10-CM

## 2017-05-19 DIAGNOSIS — M25512 Pain in left shoulder: Secondary | ICD-10-CM

## 2017-05-21 ENCOUNTER — Ambulatory Visit: Payer: Medicare Other | Admitting: Gastroenterology

## 2017-05-22 ENCOUNTER — Ambulatory Visit
Admission: RE | Admit: 2017-05-22 | Discharge: 2017-05-22 | Disposition: A | Discharge status: Home / Self Care | Payer: Medicare Other | Source: Ambulatory Visit | Attending: Surgery | Admitting: Surgery

## 2017-05-22 DIAGNOSIS — M19012 Primary osteoarthritis, left shoulder: Secondary | ICD-10-CM | POA: Diagnosis not present

## 2017-05-22 DIAGNOSIS — M75102 Unspecified rotator cuff tear or rupture of left shoulder, not specified as traumatic: Secondary | ICD-10-CM | POA: Diagnosis not present

## 2017-05-22 DIAGNOSIS — M7582 Other shoulder lesions, left shoulder: Secondary | ICD-10-CM | POA: Insufficient documentation

## 2017-05-22 DIAGNOSIS — M25812 Other specified joint disorders, left shoulder: Secondary | ICD-10-CM | POA: Insufficient documentation

## 2017-05-22 DIAGNOSIS — M25512 Pain in left shoulder: Secondary | ICD-10-CM | POA: Diagnosis present

## 2017-05-26 DIAGNOSIS — M7522 Bicipital tendinitis, left shoulder: Secondary | ICD-10-CM | POA: Diagnosis not present

## 2017-05-26 DIAGNOSIS — M24112 Other articular cartilage disorders, left shoulder: Secondary | ICD-10-CM | POA: Diagnosis not present

## 2017-05-26 DIAGNOSIS — M7582 Other shoulder lesions, left shoulder: Secondary | ICD-10-CM | POA: Diagnosis not present

## 2017-06-26 ENCOUNTER — Ambulatory Visit: Payer: Medicare Other | Admitting: Gastroenterology

## 2017-06-28 DEATH — deceased

## 2017-07-02 ENCOUNTER — Ambulatory Visit: Admit: 2017-07-02 | Payer: Medicare Other | Admitting: Surgery

## 2017-07-02 SURGERY — ARTHROSCOPY, SHOULDER
Anesthesia: Choice | Laterality: Left

## 2017-08-27 IMAGING — CT CT KNEE*R* W/O CM
3 of 8 series · 10 of 33 positions shown, 12 images · non-contrast
Comparison: None.

CLINICAL DATA: Preop right knee arthroplasty.

EXAM:
CT OF THE RIGHT KNEE WITHOUT CONTRAST
TECHNIQUE: Multidetector CT imaging of the RIGHT knee was performed according
to the standard protocol. Multiplanar CT image reconstructions were
also generated.

[Series 7: knee · axial · 0.32mm/px · z∈[+420,+603]mm · 4 of 306 slices shown, 5 images]
[im 62/306  soft-tissue]
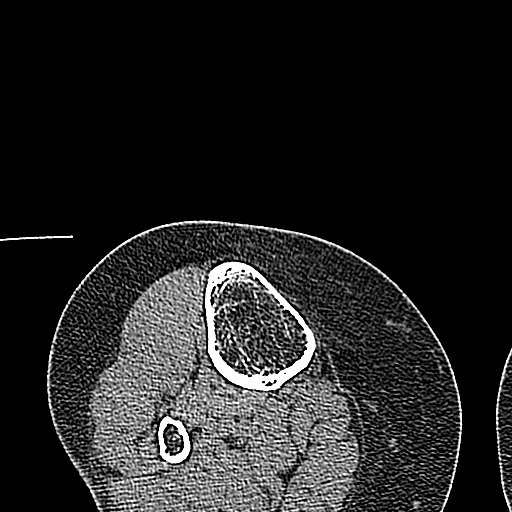
[im 62/306  bone]
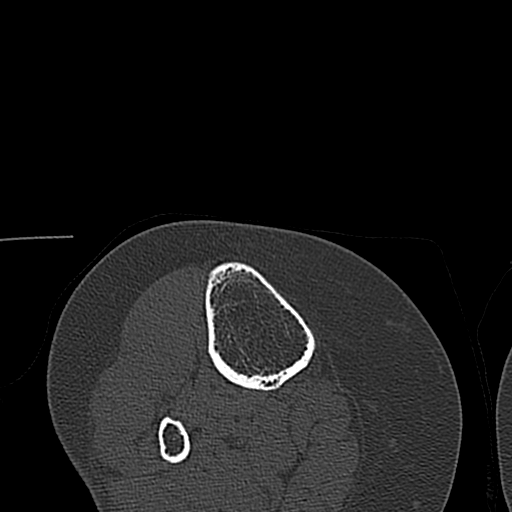
[im 123/306  bone]
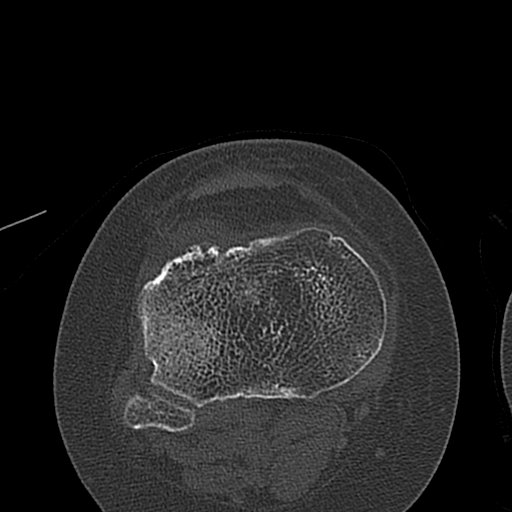
[im 184/306  bone]
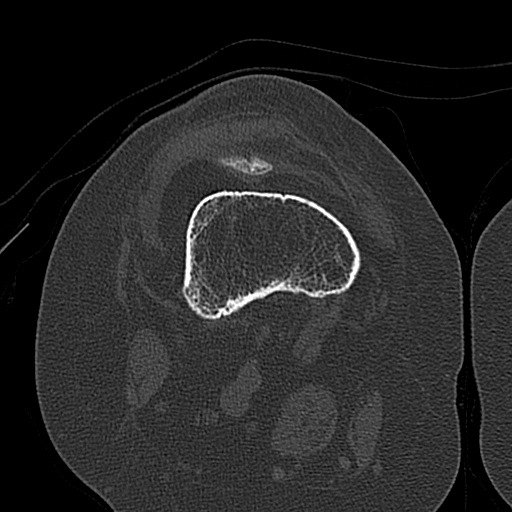
[im 245/306  bone]
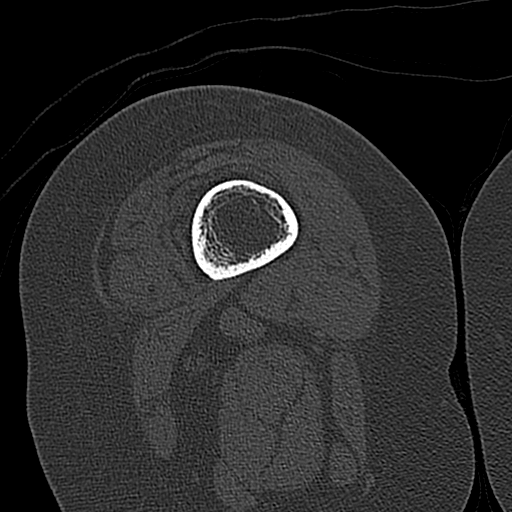

[Series 11: knee coronal bone · coronal · 0.25mm/px · 1 of 59 slices shown]
[im 30/59  bone]
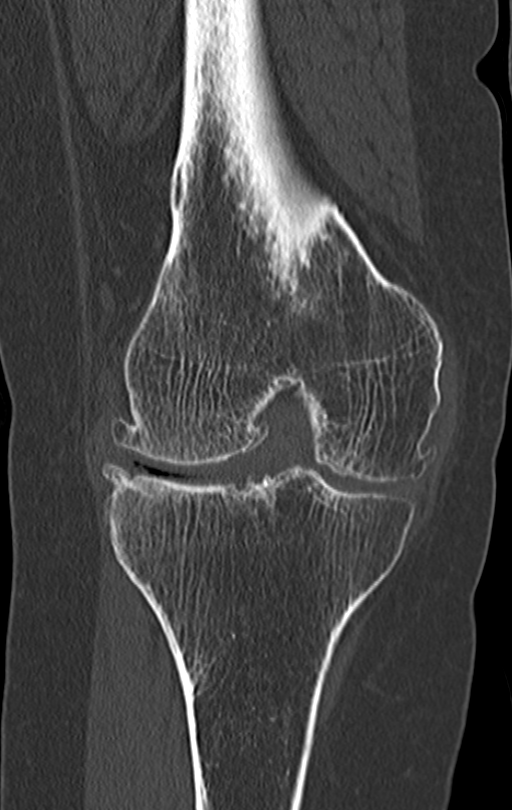

[Series 12: knee sagittal bone · sagittal · 0.25mm/px · 5 of 65 slices shown, 6 images]
[im 22/65  bone]
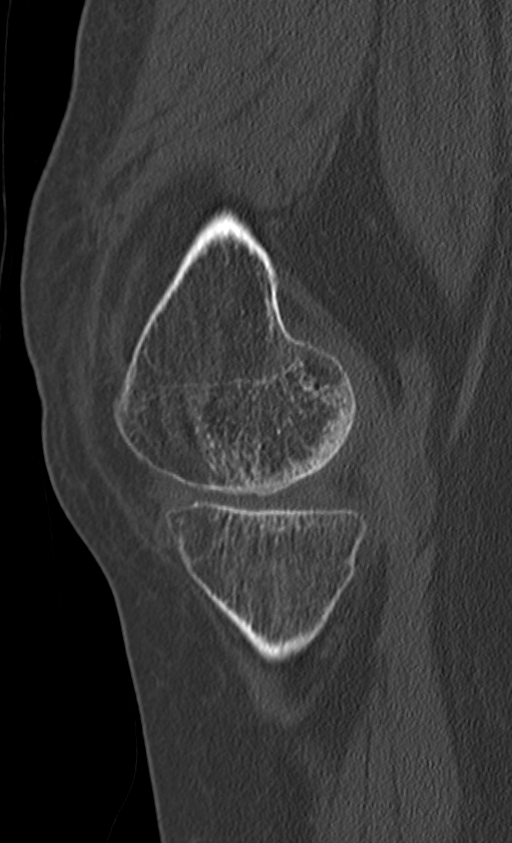
[im 27/65  bone]
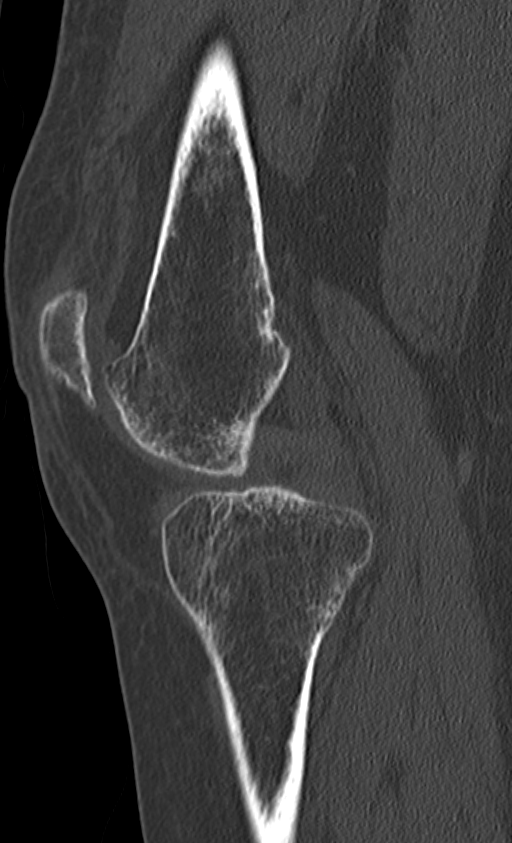
[im 33/65  soft-tissue]
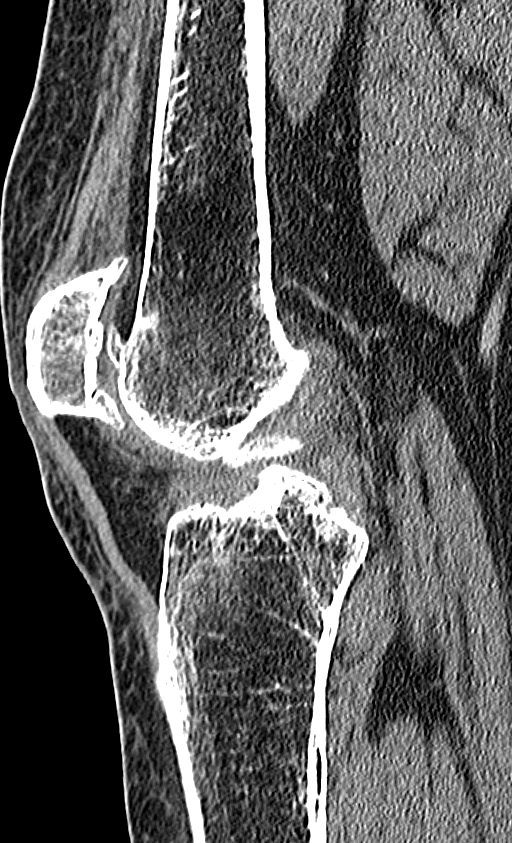
[im 33/65  bone]
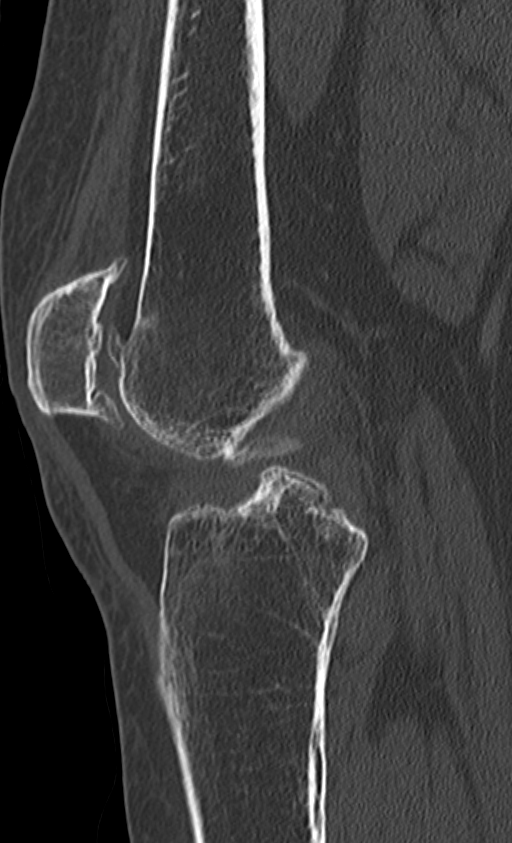
[im 38/65  bone]
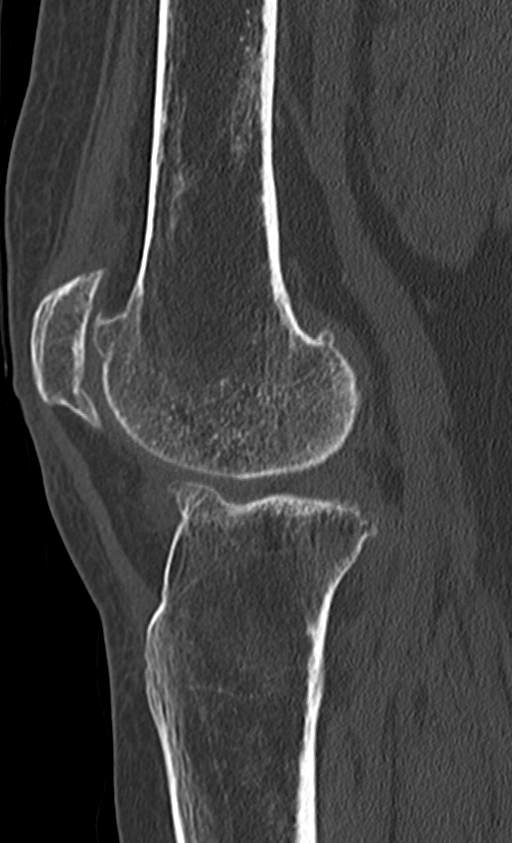
[im 43/65  bone]
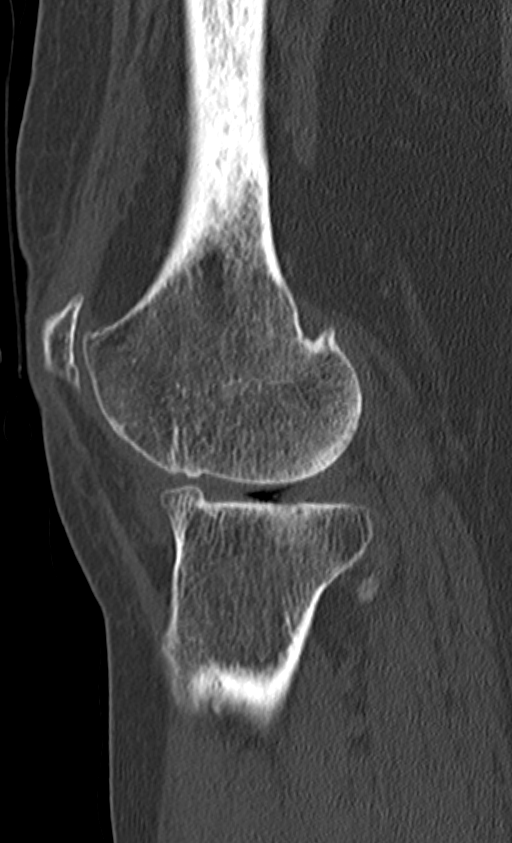

[10 of 33 positions shown; findings below may reference images not displayed]

FINDINGS: The hip demonstrates no fracture or dislocation. There is no lytic
or blastic lesion.

The knee demonstrates no fracture or dislocation. There is no lytic
or blastic lesion. There is generalized osteopenia. There is
tricompartmental osteoarthritis of the right knee most severe in the
lateral femorotibial compartment and lateral patellofemoral
compartment. Small joint effusion scratch at there is a small joint
effusion.

The ankle demonstrates no fracture or dislocation. There is no lytic
or blastic lesion.
IMPRESSION: 1. Tricompartmental osteoarthritis of the right knee most severe in
the lateral femorotibial compartment.
